# Patient Record
Sex: Female | Born: 1960 | Race: White | Hispanic: No | Marital: Married | Smoking: Never smoker
Health system: Southern US, Community
[De-identification: ages and names within clinical notes are randomized; demographics above are authoritative.]

## PROBLEM LIST (undated history)

## (undated) DIAGNOSIS — F419 Anxiety disorder, unspecified: Secondary | ICD-10-CM

## (undated) HISTORY — PX: ABDOMINAL HYSTERECTOMY: SHX81

## (undated) HISTORY — PX: KNEE SURGERY: SHX244

---

## 2000-09-08 ENCOUNTER — Inpatient Hospital Stay (HOSPITAL_COMMUNITY): Admission: AD | Admit: 2000-09-08 | Discharge: 2000-09-12 | Payer: Self-pay | Admitting: Obstetrics and Gynecology

## 2000-09-08 ENCOUNTER — Encounter (INDEPENDENT_AMBULATORY_CARE_PROVIDER_SITE_OTHER): Payer: Self-pay | Admitting: Specialist

## 2000-09-13 ENCOUNTER — Encounter: Admission: RE | Admit: 2000-09-13 | Discharge: 2000-10-13 | Payer: Self-pay | Admitting: Obstetrics & Gynecology

## 2000-10-23 ENCOUNTER — Other Ambulatory Visit: Admission: RE | Admit: 2000-10-23 | Discharge: 2000-10-23 | Payer: Self-pay | Admitting: *Deleted

## 2001-07-10 ENCOUNTER — Other Ambulatory Visit: Admission: RE | Admit: 2001-07-10 | Discharge: 2001-07-10 | Payer: Self-pay | Admitting: *Deleted

## 2002-08-27 ENCOUNTER — Other Ambulatory Visit: Admission: RE | Admit: 2002-08-27 | Discharge: 2002-08-27 | Payer: Self-pay | Admitting: *Deleted

## 2003-09-16 ENCOUNTER — Other Ambulatory Visit: Admission: RE | Admit: 2003-09-16 | Discharge: 2003-09-16 | Payer: Self-pay | Admitting: Obstetrics & Gynecology

## 2004-01-07 ENCOUNTER — Encounter (INDEPENDENT_AMBULATORY_CARE_PROVIDER_SITE_OTHER): Payer: Self-pay | Admitting: *Deleted

## 2004-01-07 ENCOUNTER — Ambulatory Visit (HOSPITAL_COMMUNITY): Admission: RE | Admit: 2004-01-07 | Discharge: 2004-01-07 | Payer: Self-pay | Admitting: Obstetrics & Gynecology

## 2004-12-07 ENCOUNTER — Other Ambulatory Visit: Admission: RE | Admit: 2004-12-07 | Discharge: 2004-12-07 | Payer: Self-pay | Admitting: Obstetrics & Gynecology

## 2007-09-30 ENCOUNTER — Emergency Department (HOSPITAL_BASED_OUTPATIENT_CLINIC_OR_DEPARTMENT_OTHER): Admission: EM | Admit: 2007-09-30 | Discharge: 2007-09-30 | Payer: Self-pay | Admitting: Emergency Medicine

## 2010-05-20 NOTE — Op Note (Signed)
[facility] of [facility]  Patient:    Tiffany Ross, Tiffany Ross Visit Number: [id number] MRN: [id number]          Service Type: OBS Location: 9300 [id number] Attending Physician:  Minette Headland Dictated by:   Freddy Finner, M.D. Proc. Date: 09/08/00 Admit Date:  09/08/2000                             Operative Report  PREOPERATIVE DIAGNOSIS:       Intrauterine pregnancy at 35-1/[redacted] weeks                               gestation with placental abruption.  POSTOPERATIVE DIAGNOSIS:      Intrauterine pregnancy at 35-1/[redacted] weeks                               gestation with placental abruption.  OPERATION:                    Primary low transverse cervical cesarean                               section with delivery of a viable female infant;                               NICU team in attendance, with Apgars of 3, 6 and                               7.  Arterial cord blood for gases is pending.  SURGEON:                      Freddy Finner, M.D.  ANESTHESIA:                   GET.  ESTIMATED INTRAOPERATIVE BLOOD LOSS:                   600 cc.  INTRAOPERATIVE COMPLICATIONS:                None.  INDICATIONS:                  The details of the present illness are recorded in the admission note.  DESCRIPTION OF PROCEDURE:     The patient was admitted from triage directly to the operating room under emergency circumstances.  She was brought to the operating room, placed in the dorsal recumbent position, a Foley catheter was placed, the abdomen was prepped and draped.  After induction of anesthesia, a low abdominal transverse incision was made and carried sharply down to the fascia which was entered sharply and extended to the extent of the skin incision.  The rectus sheath was developed superiorly and inferiorly with blunt and sharp dissection.  The rectus muscle was divided in the midline. The peritoneum was entered sharply and extended bluntly to the extent of  the skin incision.  A bladder blade was placed.  A transverse incision was made in the visceral peritoneum overlying the lower uterine segment.  The bladder was bluntly dissected off the lower segment.  A transverse incision was made  in the lower uterine segment and extended bluntly in a transverse direction.  A viable female infant was then delivered without difficulty.  The placenta was posterior with an area probably representing a 10% abruption as best could be determined after manual removal of the placenta.  There were three vessels to the cord.  Arterial cord blood was obtained for gases and routine venous blood was obtained.  After a careful evacuation of the uterine cavity, the uterine incision was closed in a single layer with running-locking 0 Monocryl.  A couple of interrupteds were required for complete hemostasis.  The bladder flap was reapproximated with a figure-of-eight of 0 Monocryl in the midline. Irrigation was carried out.  Hemostasis was adequate.  The tubes and ovaries on each side were inspected and found to be normal.  All packs, needles and instruments were removed and the counts were correct.  The abdominal incision was closed in layers; a running 0 Monocryl was used to close the peritoneum and reapproximate the rectus muscles.  A #1 Vicryl was used to close the fascia, the skin was closed with wide skin staples and quarter-inch Steri-Strips.  The patient tolerated the procedure well and was taken to recovery in good condition. Dictated by:   Freddy Finner, M.D. Attending Physician:  Minette Headland DD:  09/08/00 TD:  09/08/00 Job: [id number]

## 2010-05-20 NOTE — H&P (Signed)
[facility] of [facility]  Patient:    Tiffany Ross, Tiffany Ross Visit Number: [id number] MRN: [id number]          Service Type: OBS Location: 9300 [id number] Attending Physician:  Minette Headland Dictated by:   Freddy Finner, M.D. Admit Date:  09/08/2000                           History and Physical  ADMITTING DIAGNOSES:          1. Intrauterine pregnancy at 35-1/[redacted] weeks                                  gestation.                               2. Placenta abruption.  HISTORY OF PRESENT ILLNESS:   The patient is a 50 year old gravida 5, para 2, who has had an uncomplicated pregnancy to date, but presented with a history of heavy bright-red bleeding vaginally at approximately 2 a.m.  She presented to the emergency room where a decreased fetal heart rate was noted and evidence of bright bleeding and uterine irritability and regular contractions. On examination she was 2 cm dilated, approximately 90% effaced and there was dark-red blood consistent with abruption.  REVIEW OF SYSTEMS:            Her current review of systems is otherwise negative.  PAST HISTORY AND FAMILY HISTORY:                      According to the prenatal summary.  PHYSICAL EXAMINATION:  GENERAL:                      The patient was alert and oriented.  HEENT:                        Grossly within normal limits.  CHEST:                        Clear to auscultation.  HEART:                        Normal sinus rhythm without murmurs, rubs or gallops.  ABDOMEN:                      Gravid.  EXTREMITIES:                  Without clubbing, cyanosis or edema.  PELVIC:                       Cervix as described above.  LABORATORY DATA:              Admission hemoglobin was 10.8.  ASSESSMENT:                   Intrauterine pregnancy at 35-1/[redacted] weeks gestation with partial placental abruption.  PLAN:                         Cesarean delivery. Dictated by:   Freddy Finner, M.D. Attending  Physician:  Minette Headland DD:  09/08/00 TD:  09/08/00 Job:  [id number]

## 2010-05-20 NOTE — Discharge Summary (Signed)
[facility] of [facility]  Patient:    Tiffany Ross, Tiffany Ross Visit Number: [id number] MRN: [id number]          Service Type: OBS Location: 9300 [id number] [id number] Attending Physician:  Minette Headland Dictated by:   Danie Chandler, R.N. Admit Date:  09/08/2000 Discharge Date: 09/12/2000                             Discharge Summary  ADMISSION DIAGNOSIS:          Intrauterine pregnancy at 35-1/[redacted] weeks gestation with placental abruption.  DISCHARGE DIAGNOSES:          1. Intrauterine pregnancy at 35-1/[redacted] weeks                                  gestation with placental abruption.                               2. Anemia.  PROCEDURE:                    On September 08, 2000, primary low transverse cervical cesarean section.  REASON FOR ADMISSION:         Please see dictated H&P.  HOSPITAL COURSE:              The patient was taken to the operating room and underwent the above named procedure without complications. This was productive of a viable female infant with Apgars of 3 at one minute, 6 at five minutes, and 7 at 10 minutes.  Postoperatively, on day #1, the patient was without complaint.  Her hemoglobin on this day was 8.9, hematocrit 25.0 and white blood cell count 17.2, platelets were 172,000.  On postoperative day #2, she was ambulating well without difficulty.  Her hemoglobin on this day was 6.9, hematocrit 19.4, and platelets 137,000.  On postoperative day #3, the patient had good control of pain. She was tolerating a regular diet and had a good return of bowel function.  Her hemoglobin on this day was 6.4 and she was continued on iron. She was discharged home on postoperative day #4. Her hemoglobin on this day was 6.8.  CONDITION ON DISCHARGE:       Good.  DIET:                         Regular as tolerated.  ACTIVITY:                     No heavy lifting, no driving, no vaginal entry.  FOLLOW-UP:                    She is to follow up in the office in one  week. She is to call for temperature greater than 100 degrees, persistent nausea, vomiting, heavy vaginal bleeding, and/or redness from the incision site.  DISCHARGE MEDICATIONS:        1. Prenatal vitamin one p.o. q.d.                               2. Percocet as directed by M.D.  3. Motrin as directed by M.D.                               4. Iron daily. Dictated by:   Danie Chandler, R.N. Attending Physician:  Minette Headland DD:  10/02/00 TD:  10/02/00 Job: [id number]

## 2010-05-20 NOTE — Op Note (Signed)
Tiffany Ross, Tiffany Ross                 ACCOUNT NO.:  [id number]   MEDICAL RECORD NO.:  [id number]          PATIENT TYPE:  AMB   LOCATION:  SDC                           FACILITY:  [facility]   PHYSICIAN:  Freddy Finner, M.D.   DATE OF BIRTH:  31-Jul-1960   DATE OF PROCEDURE:  01/07/2004  DATE OF DISCHARGE:                                 OPERATIVE REPORT   PREOPERATIVE DIAGNOSES:  1.  Dysfunctional uterine bleeding.  2.  Endometrial polyp.  On ultrasound histogram, ultrasound findings      consistent with adenomyosis.   POSTOPERATIVE DIAGNOSES:  1.  Dysfunctional uterine bleeding.  2.  Endometrial polyp.  On ultrasound histogram, ultrasound findings      consistent with adenomyosis.   OPERATIVE PROCEDURE:  Hysteroscopy, D&C, resection of polyp.   ANESTHESIA:  Monitored intravenous sedation with paracervical block using 1%  plain Xylocaine.  Postoperative analgesia of Toradol 30 mg IV, 30 mg IM on  completion of the procedure.   INTRAOPERATIVE COMPLICATIONS:  None.   The patient is a 50 year old with irregular bleeding unrelieved by hormonal  cyclic therapy.  Her ultrasound histogram showed an endometrial polyp and  changes in the uterine wall consistent with adenomyosis.   She was admitted on the morning of surgery.  She was brought to the  operating room, placed under adequate intravenous sedation, placed in the  dorsal lithotomy position using the Mansura stirrups system.  Betadine prep  was carried out in the usual fashion.  Sterile drapes were applied.  A  bivalved speculum was introduced into the vagina.  The cervix was grasped  from the anterior lip with a single-toothed tenaculum.  A paracervical block  was placed using 10 cubic centimeters of 1% plain Xylocaine.  The uterus  sounded to 9 cm.  Placing the 12-1/2 degree hysteroscope was introduced  using 3% __________  as the distending medium.  Inspection revealed a polyp  as expected.  It was located on the lateral endometrial  surface to the  patient's left.  Gentle thorough curettage with exploration with the Randall  stone forceps and curettage was carried out.  Re-inspection revealed  adequate endometrial sampling and resection of the polyp.  The procedure at  this point was terminated.  All instruments were removed.  Estimated blood  loss was less than 10 cc.  __________ was approximately 80 cc.  The patient  tolerated the procedure well and was taken to the recovery room in good  condition and will be discharged home with routine outpatient surgical  instructions and she is given Darvocet to be taken as needed for  postoperative pain severe pain unmanaged by ibuprofen or Tylenol.     [name]   WRN/MEDQ  D:  01/07/2004  T:  01/07/2004  Job:  [id number]

## 2010-12-09 ENCOUNTER — Other Ambulatory Visit: Payer: Self-pay | Admitting: Obstetrics & Gynecology

## 2011-04-24 ENCOUNTER — Other Ambulatory Visit: Payer: Self-pay | Admitting: Obstetrics & Gynecology

## 2014-06-15 ENCOUNTER — Other Ambulatory Visit: Payer: Self-pay | Admitting: Obstetrics & Gynecology

## 2014-06-16 LAB — CYTOLOGY - PAP

## 2015-10-29 DIAGNOSIS — S01512A Laceration without foreign body of oral cavity, initial encounter: Secondary | ICD-10-CM | POA: Diagnosis not present

## 2015-10-29 DIAGNOSIS — Z23 Encounter for immunization: Secondary | ICD-10-CM | POA: Diagnosis not present

## 2016-03-15 DIAGNOSIS — Z13220 Encounter for screening for lipoid disorders: Secondary | ICD-10-CM | POA: Diagnosis not present

## 2016-03-15 DIAGNOSIS — Z130 Encounter for screening for diseases of the blood and blood-forming organs and certain disorders involving the immune mechanism: Secondary | ICD-10-CM | POA: Diagnosis not present

## 2016-03-15 DIAGNOSIS — Z131 Encounter for screening for diabetes mellitus: Secondary | ICD-10-CM | POA: Diagnosis not present

## 2016-03-15 DIAGNOSIS — L603 Nail dystrophy: Secondary | ICD-10-CM | POA: Diagnosis not present

## 2016-03-15 DIAGNOSIS — Z0000 Encounter for general adult medical examination without abnormal findings: Secondary | ICD-10-CM | POA: Diagnosis not present

## 2016-03-15 DIAGNOSIS — M8589 Other specified disorders of bone density and structure, multiple sites: Secondary | ICD-10-CM | POA: Diagnosis not present

## 2016-03-27 DIAGNOSIS — L723 Sebaceous cyst: Secondary | ICD-10-CM | POA: Insufficient documentation

## 2016-03-27 HISTORY — DX: Sebaceous cyst: L72.3

## 2016-04-14 DIAGNOSIS — L720 Epidermal cyst: Secondary | ICD-10-CM | POA: Diagnosis not present

## 2016-04-14 DIAGNOSIS — D1724 Benign lipomatous neoplasm of skin and subcutaneous tissue of left leg: Secondary | ICD-10-CM | POA: Insufficient documentation

## 2016-04-14 DIAGNOSIS — L723 Sebaceous cyst: Secondary | ICD-10-CM | POA: Diagnosis not present

## 2016-04-14 HISTORY — DX: Benign lipomatous neoplasm of skin and subcutaneous tissue of left leg: D17.24

## 2016-07-13 DIAGNOSIS — R05 Cough: Secondary | ICD-10-CM | POA: Diagnosis not present

## 2016-07-13 DIAGNOSIS — J0100 Acute maxillary sinusitis, unspecified: Secondary | ICD-10-CM | POA: Diagnosis not present

## 2016-08-02 DIAGNOSIS — L237 Allergic contact dermatitis due to plants, except food: Secondary | ICD-10-CM | POA: Diagnosis not present

## 2016-08-02 DIAGNOSIS — M25572 Pain in left ankle and joints of left foot: Secondary | ICD-10-CM | POA: Diagnosis not present

## 2016-08-02 DIAGNOSIS — M25472 Effusion, left ankle: Secondary | ICD-10-CM | POA: Diagnosis not present

## 2016-08-02 DIAGNOSIS — M79672 Pain in left foot: Secondary | ICD-10-CM | POA: Diagnosis not present

## 2016-08-30 DIAGNOSIS — Z6827 Body mass index (BMI) 27.0-27.9, adult: Secondary | ICD-10-CM | POA: Diagnosis not present

## 2016-08-30 DIAGNOSIS — Z01419 Encounter for gynecological examination (general) (routine) without abnormal findings: Secondary | ICD-10-CM | POA: Diagnosis not present

## 2016-08-30 DIAGNOSIS — Z1231 Encounter for screening mammogram for malignant neoplasm of breast: Secondary | ICD-10-CM | POA: Diagnosis not present

## 2016-11-01 DIAGNOSIS — M79671 Pain in right foot: Secondary | ICD-10-CM | POA: Diagnosis not present

## 2016-11-16 DIAGNOSIS — S93491A Sprain of other ligament of right ankle, initial encounter: Secondary | ICD-10-CM | POA: Diagnosis not present

## 2016-11-16 DIAGNOSIS — M76891 Other specified enthesopathies of right lower limb, excluding foot: Secondary | ICD-10-CM | POA: Diagnosis not present

## 2016-11-16 DIAGNOSIS — M7631 Iliotibial band syndrome, right leg: Secondary | ICD-10-CM | POA: Diagnosis not present

## 2016-11-20 DIAGNOSIS — M76891 Other specified enthesopathies of right lower limb, excluding foot: Secondary | ICD-10-CM | POA: Diagnosis not present

## 2016-11-20 DIAGNOSIS — M7631 Iliotibial band syndrome, right leg: Secondary | ICD-10-CM | POA: Diagnosis not present

## 2016-11-21 DIAGNOSIS — M76891 Other specified enthesopathies of right lower limb, excluding foot: Secondary | ICD-10-CM | POA: Diagnosis not present

## 2016-11-21 DIAGNOSIS — M7631 Iliotibial band syndrome, right leg: Secondary | ICD-10-CM | POA: Diagnosis not present

## 2016-11-27 DIAGNOSIS — M76891 Other specified enthesopathies of right lower limb, excluding foot: Secondary | ICD-10-CM | POA: Diagnosis not present

## 2016-11-27 DIAGNOSIS — M7631 Iliotibial band syndrome, right leg: Secondary | ICD-10-CM | POA: Diagnosis not present

## 2016-11-30 DIAGNOSIS — M7631 Iliotibial band syndrome, right leg: Secondary | ICD-10-CM | POA: Diagnosis not present

## 2016-11-30 DIAGNOSIS — M76891 Other specified enthesopathies of right lower limb, excluding foot: Secondary | ICD-10-CM | POA: Diagnosis not present

## 2016-12-04 DIAGNOSIS — M76891 Other specified enthesopathies of right lower limb, excluding foot: Secondary | ICD-10-CM | POA: Diagnosis not present

## 2016-12-04 DIAGNOSIS — M7631 Iliotibial band syndrome, right leg: Secondary | ICD-10-CM | POA: Diagnosis not present

## 2016-12-07 DIAGNOSIS — M7631 Iliotibial band syndrome, right leg: Secondary | ICD-10-CM | POA: Diagnosis not present

## 2016-12-07 DIAGNOSIS — M76891 Other specified enthesopathies of right lower limb, excluding foot: Secondary | ICD-10-CM | POA: Diagnosis not present

## 2016-12-13 DIAGNOSIS — M76891 Other specified enthesopathies of right lower limb, excluding foot: Secondary | ICD-10-CM | POA: Diagnosis not present

## 2016-12-13 DIAGNOSIS — M7631 Iliotibial band syndrome, right leg: Secondary | ICD-10-CM | POA: Diagnosis not present

## 2016-12-15 DIAGNOSIS — M76891 Other specified enthesopathies of right lower limb, excluding foot: Secondary | ICD-10-CM | POA: Diagnosis not present

## 2016-12-15 DIAGNOSIS — M7631 Iliotibial band syndrome, right leg: Secondary | ICD-10-CM | POA: Diagnosis not present

## 2016-12-18 DIAGNOSIS — M7631 Iliotibial band syndrome, right leg: Secondary | ICD-10-CM | POA: Diagnosis not present

## 2016-12-18 DIAGNOSIS — M76891 Other specified enthesopathies of right lower limb, excluding foot: Secondary | ICD-10-CM | POA: Diagnosis not present

## 2016-12-19 DIAGNOSIS — S93491D Sprain of other ligament of right ankle, subsequent encounter: Secondary | ICD-10-CM | POA: Diagnosis not present

## 2016-12-19 DIAGNOSIS — M76891 Other specified enthesopathies of right lower limb, excluding foot: Secondary | ICD-10-CM | POA: Diagnosis not present

## 2016-12-19 DIAGNOSIS — M7631 Iliotibial band syndrome, right leg: Secondary | ICD-10-CM | POA: Diagnosis not present

## 2016-12-21 DIAGNOSIS — M76891 Other specified enthesopathies of right lower limb, excluding foot: Secondary | ICD-10-CM | POA: Diagnosis not present

## 2016-12-21 DIAGNOSIS — M7631 Iliotibial band syndrome, right leg: Secondary | ICD-10-CM | POA: Diagnosis not present

## 2016-12-27 DIAGNOSIS — M76891 Other specified enthesopathies of right lower limb, excluding foot: Secondary | ICD-10-CM | POA: Diagnosis not present

## 2017-01-01 DIAGNOSIS — M76891 Other specified enthesopathies of right lower limb, excluding foot: Secondary | ICD-10-CM | POA: Diagnosis not present

## 2017-01-01 DIAGNOSIS — M7061 Trochanteric bursitis, right hip: Secondary | ICD-10-CM | POA: Diagnosis not present

## 2017-01-01 DIAGNOSIS — M7631 Iliotibial band syndrome, right leg: Secondary | ICD-10-CM | POA: Diagnosis not present

## 2017-01-15 DIAGNOSIS — M7631 Iliotibial band syndrome, right leg: Secondary | ICD-10-CM | POA: Diagnosis not present

## 2017-01-15 DIAGNOSIS — M76891 Other specified enthesopathies of right lower limb, excluding foot: Secondary | ICD-10-CM | POA: Diagnosis not present

## 2017-01-18 DIAGNOSIS — M76891 Other specified enthesopathies of right lower limb, excluding foot: Secondary | ICD-10-CM | POA: Diagnosis not present

## 2017-01-18 DIAGNOSIS — M7631 Iliotibial band syndrome, right leg: Secondary | ICD-10-CM | POA: Diagnosis not present

## 2017-01-23 DIAGNOSIS — M7631 Iliotibial band syndrome, right leg: Secondary | ICD-10-CM | POA: Diagnosis not present

## 2017-01-23 DIAGNOSIS — M76891 Other specified enthesopathies of right lower limb, excluding foot: Secondary | ICD-10-CM | POA: Diagnosis not present

## 2017-01-25 DIAGNOSIS — M7631 Iliotibial band syndrome, right leg: Secondary | ICD-10-CM | POA: Diagnosis not present

## 2017-01-25 DIAGNOSIS — M76891 Other specified enthesopathies of right lower limb, excluding foot: Secondary | ICD-10-CM | POA: Diagnosis not present

## 2017-01-30 DIAGNOSIS — M24151 Other articular cartilage disorders, right hip: Secondary | ICD-10-CM | POA: Diagnosis not present

## 2017-01-30 DIAGNOSIS — M25551 Pain in right hip: Secondary | ICD-10-CM | POA: Diagnosis not present

## 2017-02-07 DIAGNOSIS — M1611 Unilateral primary osteoarthritis, right hip: Secondary | ICD-10-CM

## 2017-02-07 HISTORY — DX: Unilateral primary osteoarthritis, right hip: M16.11

## 2017-06-04 DIAGNOSIS — M25562 Pain in left knee: Secondary | ICD-10-CM | POA: Diagnosis not present

## 2017-06-04 DIAGNOSIS — M238X2 Other internal derangements of left knee: Secondary | ICD-10-CM | POA: Diagnosis not present

## 2017-06-13 DIAGNOSIS — M25562 Pain in left knee: Secondary | ICD-10-CM | POA: Diagnosis not present

## 2017-06-20 DIAGNOSIS — M2242 Chondromalacia patellae, left knee: Secondary | ICD-10-CM | POA: Diagnosis not present

## 2017-06-20 DIAGNOSIS — M25562 Pain in left knee: Secondary | ICD-10-CM | POA: Diagnosis not present

## 2017-07-10 DIAGNOSIS — M25562 Pain in left knee: Secondary | ICD-10-CM | POA: Diagnosis not present

## 2017-08-01 DIAGNOSIS — B001 Herpesviral vesicular dermatitis: Secondary | ICD-10-CM | POA: Diagnosis not present

## 2017-08-01 DIAGNOSIS — L237 Allergic contact dermatitis due to plants, except food: Secondary | ICD-10-CM | POA: Diagnosis not present

## 2017-08-27 DIAGNOSIS — R5383 Other fatigue: Secondary | ICD-10-CM | POA: Diagnosis not present

## 2017-08-27 DIAGNOSIS — G44019 Episodic cluster headache, not intractable: Secondary | ICD-10-CM | POA: Diagnosis not present

## 2017-09-05 DIAGNOSIS — Z1231 Encounter for screening mammogram for malignant neoplasm of breast: Secondary | ICD-10-CM | POA: Diagnosis not present

## 2017-09-05 DIAGNOSIS — Z01419 Encounter for gynecological examination (general) (routine) without abnormal findings: Secondary | ICD-10-CM | POA: Diagnosis not present

## 2017-09-05 DIAGNOSIS — Z6827 Body mass index (BMI) 27.0-27.9, adult: Secondary | ICD-10-CM | POA: Diagnosis not present

## 2017-10-09 DIAGNOSIS — M67462 Ganglion, left knee: Secondary | ICD-10-CM | POA: Diagnosis not present

## 2017-10-25 DIAGNOSIS — M25562 Pain in left knee: Secondary | ICD-10-CM | POA: Diagnosis not present

## 2017-11-21 DIAGNOSIS — M9901 Segmental and somatic dysfunction of cervical region: Secondary | ICD-10-CM | POA: Diagnosis not present

## 2017-11-21 DIAGNOSIS — M50322 Other cervical disc degeneration at C5-C6 level: Secondary | ICD-10-CM | POA: Diagnosis not present

## 2017-11-21 DIAGNOSIS — M9902 Segmental and somatic dysfunction of thoracic region: Secondary | ICD-10-CM | POA: Diagnosis not present

## 2017-11-21 DIAGNOSIS — M7912 Myalgia of auxiliary muscles, head and neck: Secondary | ICD-10-CM | POA: Diagnosis not present

## 2017-11-23 DIAGNOSIS — M50322 Other cervical disc degeneration at C5-C6 level: Secondary | ICD-10-CM | POA: Diagnosis not present

## 2017-11-23 DIAGNOSIS — M9902 Segmental and somatic dysfunction of thoracic region: Secondary | ICD-10-CM | POA: Diagnosis not present

## 2017-11-23 DIAGNOSIS — M7912 Myalgia of auxiliary muscles, head and neck: Secondary | ICD-10-CM | POA: Diagnosis not present

## 2017-11-23 DIAGNOSIS — M9901 Segmental and somatic dysfunction of cervical region: Secondary | ICD-10-CM | POA: Diagnosis not present

## 2017-11-26 DIAGNOSIS — M7912 Myalgia of auxiliary muscles, head and neck: Secondary | ICD-10-CM | POA: Diagnosis not present

## 2017-11-26 DIAGNOSIS — M50322 Other cervical disc degeneration at C5-C6 level: Secondary | ICD-10-CM | POA: Diagnosis not present

## 2017-11-26 DIAGNOSIS — M9901 Segmental and somatic dysfunction of cervical region: Secondary | ICD-10-CM | POA: Diagnosis not present

## 2017-11-26 DIAGNOSIS — M9902 Segmental and somatic dysfunction of thoracic region: Secondary | ICD-10-CM | POA: Diagnosis not present

## 2017-11-27 DIAGNOSIS — M9901 Segmental and somatic dysfunction of cervical region: Secondary | ICD-10-CM | POA: Diagnosis not present

## 2017-11-27 DIAGNOSIS — M50322 Other cervical disc degeneration at C5-C6 level: Secondary | ICD-10-CM | POA: Diagnosis not present

## 2017-11-27 DIAGNOSIS — M9902 Segmental and somatic dysfunction of thoracic region: Secondary | ICD-10-CM | POA: Diagnosis not present

## 2017-11-27 DIAGNOSIS — M7912 Myalgia of auxiliary muscles, head and neck: Secondary | ICD-10-CM | POA: Diagnosis not present

## 2017-12-03 DIAGNOSIS — M9901 Segmental and somatic dysfunction of cervical region: Secondary | ICD-10-CM | POA: Diagnosis not present

## 2017-12-03 DIAGNOSIS — M7912 Myalgia of auxiliary muscles, head and neck: Secondary | ICD-10-CM | POA: Diagnosis not present

## 2017-12-03 DIAGNOSIS — M9902 Segmental and somatic dysfunction of thoracic region: Secondary | ICD-10-CM | POA: Diagnosis not present

## 2017-12-03 DIAGNOSIS — M50322 Other cervical disc degeneration at C5-C6 level: Secondary | ICD-10-CM | POA: Diagnosis not present

## 2017-12-05 DIAGNOSIS — M9901 Segmental and somatic dysfunction of cervical region: Secondary | ICD-10-CM | POA: Diagnosis not present

## 2017-12-05 DIAGNOSIS — M50322 Other cervical disc degeneration at C5-C6 level: Secondary | ICD-10-CM | POA: Diagnosis not present

## 2017-12-05 DIAGNOSIS — M9902 Segmental and somatic dysfunction of thoracic region: Secondary | ICD-10-CM | POA: Diagnosis not present

## 2017-12-05 DIAGNOSIS — M7912 Myalgia of auxiliary muscles, head and neck: Secondary | ICD-10-CM | POA: Diagnosis not present

## 2017-12-07 DIAGNOSIS — M7912 Myalgia of auxiliary muscles, head and neck: Secondary | ICD-10-CM | POA: Diagnosis not present

## 2017-12-07 DIAGNOSIS — M9901 Segmental and somatic dysfunction of cervical region: Secondary | ICD-10-CM | POA: Diagnosis not present

## 2017-12-07 DIAGNOSIS — M50322 Other cervical disc degeneration at C5-C6 level: Secondary | ICD-10-CM | POA: Diagnosis not present

## 2017-12-07 DIAGNOSIS — M9902 Segmental and somatic dysfunction of thoracic region: Secondary | ICD-10-CM | POA: Diagnosis not present

## 2017-12-10 DIAGNOSIS — M50322 Other cervical disc degeneration at C5-C6 level: Secondary | ICD-10-CM | POA: Diagnosis not present

## 2017-12-10 DIAGNOSIS — M7912 Myalgia of auxiliary muscles, head and neck: Secondary | ICD-10-CM | POA: Diagnosis not present

## 2017-12-10 DIAGNOSIS — M9902 Segmental and somatic dysfunction of thoracic region: Secondary | ICD-10-CM | POA: Diagnosis not present

## 2017-12-10 DIAGNOSIS — M9901 Segmental and somatic dysfunction of cervical region: Secondary | ICD-10-CM | POA: Diagnosis not present

## 2017-12-13 DIAGNOSIS — M9901 Segmental and somatic dysfunction of cervical region: Secondary | ICD-10-CM | POA: Diagnosis not present

## 2017-12-13 DIAGNOSIS — M7912 Myalgia of auxiliary muscles, head and neck: Secondary | ICD-10-CM | POA: Diagnosis not present

## 2017-12-13 DIAGNOSIS — M50322 Other cervical disc degeneration at C5-C6 level: Secondary | ICD-10-CM | POA: Diagnosis not present

## 2017-12-13 DIAGNOSIS — M9902 Segmental and somatic dysfunction of thoracic region: Secondary | ICD-10-CM | POA: Diagnosis not present

## 2017-12-17 DIAGNOSIS — M9901 Segmental and somatic dysfunction of cervical region: Secondary | ICD-10-CM | POA: Diagnosis not present

## 2017-12-17 DIAGNOSIS — M50322 Other cervical disc degeneration at C5-C6 level: Secondary | ICD-10-CM | POA: Diagnosis not present

## 2017-12-17 DIAGNOSIS — M7912 Myalgia of auxiliary muscles, head and neck: Secondary | ICD-10-CM | POA: Diagnosis not present

## 2017-12-17 DIAGNOSIS — M9902 Segmental and somatic dysfunction of thoracic region: Secondary | ICD-10-CM | POA: Diagnosis not present

## 2017-12-20 DIAGNOSIS — M50322 Other cervical disc degeneration at C5-C6 level: Secondary | ICD-10-CM | POA: Diagnosis not present

## 2017-12-20 DIAGNOSIS — M9902 Segmental and somatic dysfunction of thoracic region: Secondary | ICD-10-CM | POA: Diagnosis not present

## 2017-12-20 DIAGNOSIS — M7912 Myalgia of auxiliary muscles, head and neck: Secondary | ICD-10-CM | POA: Diagnosis not present

## 2017-12-20 DIAGNOSIS — M9901 Segmental and somatic dysfunction of cervical region: Secondary | ICD-10-CM | POA: Diagnosis not present

## 2017-12-24 DIAGNOSIS — M7912 Myalgia of auxiliary muscles, head and neck: Secondary | ICD-10-CM | POA: Diagnosis not present

## 2017-12-24 DIAGNOSIS — M50322 Other cervical disc degeneration at C5-C6 level: Secondary | ICD-10-CM | POA: Diagnosis not present

## 2017-12-24 DIAGNOSIS — M9901 Segmental and somatic dysfunction of cervical region: Secondary | ICD-10-CM | POA: Diagnosis not present

## 2017-12-24 DIAGNOSIS — M9902 Segmental and somatic dysfunction of thoracic region: Secondary | ICD-10-CM | POA: Diagnosis not present

## 2017-12-28 DIAGNOSIS — M9901 Segmental and somatic dysfunction of cervical region: Secondary | ICD-10-CM | POA: Diagnosis not present

## 2017-12-28 DIAGNOSIS — M50322 Other cervical disc degeneration at C5-C6 level: Secondary | ICD-10-CM | POA: Diagnosis not present

## 2017-12-28 DIAGNOSIS — M9902 Segmental and somatic dysfunction of thoracic region: Secondary | ICD-10-CM | POA: Diagnosis not present

## 2017-12-28 DIAGNOSIS — M7912 Myalgia of auxiliary muscles, head and neck: Secondary | ICD-10-CM | POA: Diagnosis not present

## 2017-12-31 DIAGNOSIS — M7912 Myalgia of auxiliary muscles, head and neck: Secondary | ICD-10-CM | POA: Diagnosis not present

## 2017-12-31 DIAGNOSIS — M50322 Other cervical disc degeneration at C5-C6 level: Secondary | ICD-10-CM | POA: Diagnosis not present

## 2017-12-31 DIAGNOSIS — M9902 Segmental and somatic dysfunction of thoracic region: Secondary | ICD-10-CM | POA: Diagnosis not present

## 2017-12-31 DIAGNOSIS — M9901 Segmental and somatic dysfunction of cervical region: Secondary | ICD-10-CM | POA: Diagnosis not present

## 2018-01-03 DIAGNOSIS — M50322 Other cervical disc degeneration at C5-C6 level: Secondary | ICD-10-CM | POA: Diagnosis not present

## 2018-01-03 DIAGNOSIS — M9901 Segmental and somatic dysfunction of cervical region: Secondary | ICD-10-CM | POA: Diagnosis not present

## 2018-01-10 DIAGNOSIS — M50322 Other cervical disc degeneration at C5-C6 level: Secondary | ICD-10-CM | POA: Diagnosis not present

## 2018-01-10 DIAGNOSIS — M9901 Segmental and somatic dysfunction of cervical region: Secondary | ICD-10-CM | POA: Diagnosis not present

## 2018-01-17 DIAGNOSIS — M9901 Segmental and somatic dysfunction of cervical region: Secondary | ICD-10-CM | POA: Diagnosis not present

## 2018-01-17 DIAGNOSIS — M50322 Other cervical disc degeneration at C5-C6 level: Secondary | ICD-10-CM | POA: Diagnosis not present

## 2018-01-22 DIAGNOSIS — M542 Cervicalgia: Secondary | ICD-10-CM | POA: Diagnosis not present

## 2018-02-01 DIAGNOSIS — M542 Cervicalgia: Secondary | ICD-10-CM | POA: Diagnosis not present

## 2018-02-02 DIAGNOSIS — M5030 Other cervical disc degeneration, unspecified cervical region: Secondary | ICD-10-CM | POA: Diagnosis not present

## 2018-02-08 DIAGNOSIS — M542 Cervicalgia: Secondary | ICD-10-CM | POA: Diagnosis not present

## 2018-02-15 DIAGNOSIS — M542 Cervicalgia: Secondary | ICD-10-CM | POA: Diagnosis not present

## 2018-02-21 DIAGNOSIS — M542 Cervicalgia: Secondary | ICD-10-CM | POA: Diagnosis not present

## 2018-02-27 DIAGNOSIS — M542 Cervicalgia: Secondary | ICD-10-CM | POA: Diagnosis not present

## 2018-06-03 DIAGNOSIS — S61201A Unspecified open wound of left index finger without damage to nail, initial encounter: Secondary | ICD-10-CM | POA: Diagnosis not present

## 2018-07-17 DIAGNOSIS — H43813 Vitreous degeneration, bilateral: Secondary | ICD-10-CM | POA: Diagnosis not present

## 2018-08-07 DIAGNOSIS — S91331A Puncture wound without foreign body, right foot, initial encounter: Secondary | ICD-10-CM | POA: Diagnosis not present

## 2018-08-21 DIAGNOSIS — Z23 Encounter for immunization: Secondary | ICD-10-CM | POA: Diagnosis not present

## 2018-10-23 DIAGNOSIS — Z1231 Encounter for screening mammogram for malignant neoplasm of breast: Secondary | ICD-10-CM | POA: Diagnosis not present

## 2018-10-23 DIAGNOSIS — Z13820 Encounter for screening for osteoporosis: Secondary | ICD-10-CM | POA: Diagnosis not present

## 2018-10-23 DIAGNOSIS — Z01419 Encounter for gynecological examination (general) (routine) without abnormal findings: Secondary | ICD-10-CM | POA: Diagnosis not present

## 2018-10-23 DIAGNOSIS — Z6828 Body mass index (BMI) 28.0-28.9, adult: Secondary | ICD-10-CM | POA: Diagnosis not present

## 2019-08-18 DIAGNOSIS — L237 Allergic contact dermatitis due to plants, except food: Secondary | ICD-10-CM | POA: Diagnosis not present

## 2019-08-31 ENCOUNTER — Other Ambulatory Visit: Payer: Self-pay

## 2019-08-31 ENCOUNTER — Encounter (HOSPITAL_BASED_OUTPATIENT_CLINIC_OR_DEPARTMENT_OTHER): Payer: Self-pay | Admitting: Emergency Medicine

## 2019-08-31 DIAGNOSIS — N132 Hydronephrosis with renal and ureteral calculous obstruction: Secondary | ICD-10-CM | POA: Diagnosis not present

## 2019-08-31 DIAGNOSIS — N138 Other obstructive and reflux uropathy: Secondary | ICD-10-CM | POA: Diagnosis not present

## 2019-08-31 DIAGNOSIS — N200 Calculus of kidney: Secondary | ICD-10-CM | POA: Insufficient documentation

## 2019-08-31 DIAGNOSIS — R1111 Vomiting without nausea: Secondary | ICD-10-CM | POA: Diagnosis not present

## 2019-08-31 DIAGNOSIS — R109 Unspecified abdominal pain: Secondary | ICD-10-CM | POA: Diagnosis not present

## 2019-08-31 DIAGNOSIS — I700 Atherosclerosis of aorta: Secondary | ICD-10-CM | POA: Diagnosis not present

## 2019-08-31 LAB — BASIC METABOLIC PANEL
Anion gap: 12 (ref 5–15)
BUN: 17 mg/dL (ref 6–20)
CO2: 24 mmol/L (ref 22–32)
Calcium: 10 mg/dL (ref 8.9–10.3)
Chloride: 102 mmol/L (ref 98–111)
Creatinine, Ser: 0.86 mg/dL (ref 0.44–1.00)
GFR calc Af Amer: >60 mL/min (ref >60)
GFR calc non Af Amer: >60 mL/min (ref >60)
Glucose, Bld: 130 mg/dL — ABNORMAL HIGH (ref 70–99)
Potassium: 4 mmol/L (ref 3.5–5.1)
Sodium: 138 mmol/L (ref 135–145)

## 2019-08-31 LAB — CBC
HCT: 42.5 % (ref 36.0–46.0)
Hemoglobin: 14.2 g/dL (ref 12.0–15.0)
MCH: 30 pg (ref 26.0–34.0)
MCHC: 33.4 g/dL (ref 30.0–36.0)
MCV: 89.9 fL (ref 80.0–100.0)
Platelets: 324 10*3/uL (ref 150–400)
RBC: 4.73 MIL/uL (ref 3.87–5.11)
RDW: 12.3 % (ref 11.5–15.5)
WBC: 10.3 10*3/uL (ref 4.0–10.5)
nRBC: 0 % (ref 0.0–0.2)

## 2019-08-31 MED ORDER — ONDANSETRON HCL 4 MG/2ML IJ SOLN
4.0000 mg | Freq: Once | INTRAMUSCULAR | Status: AC
  Administered 2019-08-31: 4 mg via INTRAVENOUS
  Filled 2019-08-31: qty 2

## 2019-08-31 MED ORDER — FENTANYL CITRATE (PF) 100 MCG/2ML IJ SOLN
50.0000 ug | INTRAMUSCULAR | Status: AC | PRN
  Administered 2019-08-31 – 2019-09-01 (×2): 50 ug via INTRAVENOUS
  Filled 2019-08-31 (×2): qty 2

## 2019-08-31 NOTE — ED Triage Notes (Signed)
Reports right flank pain that around 1930 with n/v.  Also having diarrhea.  Denies having any urinary symptoms.

## 2019-09-01 ENCOUNTER — Emergency Department (HOSPITAL_BASED_OUTPATIENT_CLINIC_OR_DEPARTMENT_OTHER)
Admission: EM | Admit: 2019-09-01 | Discharge: 2019-09-01 | Disposition: A | Discharge status: Home / Self Care | Payer: BC Managed Care – PPO | Attending: Emergency Medicine | Admitting: Emergency Medicine

## 2019-09-01 ENCOUNTER — Emergency Department (HOSPITAL_BASED_OUTPATIENT_CLINIC_OR_DEPARTMENT_OTHER): Payer: BC Managed Care – PPO

## 2019-09-01 DIAGNOSIS — N138 Other obstructive and reflux uropathy: Secondary | ICD-10-CM | POA: Diagnosis not present

## 2019-09-01 DIAGNOSIS — N132 Hydronephrosis with renal and ureteral calculous obstruction: Secondary | ICD-10-CM | POA: Diagnosis not present

## 2019-09-01 DIAGNOSIS — R1111 Vomiting without nausea: Secondary | ICD-10-CM | POA: Diagnosis not present

## 2019-09-01 DIAGNOSIS — N200 Calculus of kidney: Secondary | ICD-10-CM

## 2019-09-01 DIAGNOSIS — I700 Atherosclerosis of aorta: Secondary | ICD-10-CM | POA: Diagnosis not present

## 2019-09-01 HISTORY — DX: Anxiety disorder, unspecified: F41.9

## 2019-09-01 LAB — URINALYSIS, ROUTINE W REFLEX MICROSCOPIC
Bilirubin Urine: NEGATIVE
Glucose, UA: NEGATIVE mg/dL
Ketones, ur: NEGATIVE mg/dL
Leukocytes,Ua: NEGATIVE
Nitrite: NEGATIVE
Protein, ur: NEGATIVE mg/dL
Specific Gravity, Urine: 1.015 (ref 1.005–1.030)
pH: 8.5 — ABNORMAL HIGH (ref 5.0–8.0)

## 2019-09-01 LAB — URINALYSIS, MICROSCOPIC (REFLEX): WBC, UA: NONE SEEN WBC/hpf (ref 0–5)

## 2019-09-01 MED ORDER — KETOROLAC TROMETHAMINE 15 MG/ML IJ SOLN
15.0000 mg | Freq: Once | INTRAMUSCULAR | Status: AC
  Administered 2019-09-01: 15 mg via INTRAVENOUS
  Filled 2019-09-01: qty 1

## 2019-09-01 MED ORDER — ONDANSETRON HCL 4 MG/2ML IJ SOLN
4.0000 mg | Freq: Once | INTRAMUSCULAR | Status: AC
  Administered 2019-09-01: 4 mg via INTRAVENOUS
  Filled 2019-09-01: qty 2

## 2019-09-01 MED ORDER — TAMSULOSIN HCL 0.4 MG PO CAPS: 0.4000 mg | ORAL_CAPSULE | Freq: Every day | ORAL | 0 refills | Status: DC

## 2019-09-01 MED ORDER — ONDANSETRON 4 MG PO TBDP: 4.0000 mg | ORAL_TABLET | Freq: Three times a day (TID) | ORAL | 0 refills | Status: DC | PRN

## 2019-09-01 MED ORDER — HYDROCODONE-ACETAMINOPHEN 5-325 MG PO TABS: 1.0000 | ORAL_TABLET | Freq: Four times a day (QID) | ORAL | 0 refills | Status: DC | PRN

## 2019-09-01 MED ORDER — SODIUM CHLORIDE 0.9 % IV BOLUS
1000.0000 mL | Freq: Once | INTRAVENOUS | Status: AC
  Administered 2019-09-01: 1000 mL via INTRAVENOUS

## 2019-09-01 NOTE — Discharge Instructions (Addendum)
You were seen today for abdominal pain.  You have a kidney stone on the right side.  Make sure to stay hydrated.  Take medications as prescribed.  Follow-up with urology as needed.

## 2019-09-01 NOTE — ED Provider Notes (Signed)
[facility] EMERGENCY DEPARTMENT Provider Note   CSN: [id number] Arrival date & time: 08/31/19  2239     History Chief Complaint  Patient presents with  . Flank Pain    Tiffany Ross is a 59 y.o. female.  HPI     This is a 59 year old female who presents with abdominal pain.  Patient reports that she had "a good day yesterday."  She states that around 7:30 PM she noted some right-sided abdominal and flank pain.  She describes the pain as "weird."  It comes and goes.  Nothing seems to make it better or worse.  She states her pain in the waiting room was 8 out of 10.  Is now 3 out of 10 after fentanyl.  She is not noted any fevers or urinary symptoms.  She has had some nausea and vomiting.  No diarrhea.  Reports normal bowel movements.  No history of kidney stones.  Past Medical History:  Diagnosis Date  . Anxiety     There are no problems to display for this patient.   Past Surgical History:  Procedure Laterality Date  . ABDOMINAL HYSTERECTOMY    . KNEE SURGERY       OB History   No obstetric history on file.     No family history on file.  Social History   Tobacco Use  . Smoking status: Never Smoker  . Smokeless tobacco: Never Used  Substance Use Topics  . Alcohol use: Yes    Alcohol/week: 1.0 standard drink    Types: 1 Glasses of wine per week    Comment: daily  . Drug use: Never    Home Medications Prior to Admission medications   Medication Sig Start Date End Date Taking? Authorizing Provider  HYDROcodone-acetaminophen (NORCO/VICODIN) 5-325 MG tablet Take 1 tablet by mouth every 6 (six) hours as needed. 09/01/19   Taray Normoyle, Barbette Hair, MD  ondansetron (ZOFRAN ODT) 4 MG disintegrating tablet Take 1 tablet (4 mg total) by mouth every 8 (eight) hours as needed. 09/01/19   Kassadi Presswood, Barbette Hair, MD  tamsulosin (FLOMAX) 0.4 MG CAPS capsule Take 1 capsule (0.4 mg total) by mouth daily. 09/01/19   Adean Milosevic, Barbette Hair, MD    Allergies    Codeine and Sulfa  antibiotics  Review of Systems   Review of Systems  Constitutional: Negative for fever.  Respiratory: Negative for shortness of breath.   Cardiovascular: Negative for chest pain.  Gastrointestinal: Positive for abdominal pain.  Genitourinary: Positive for flank pain. Negative for dysuria and hematuria.  Musculoskeletal: Positive for back pain.  All other systems reviewed and are negative.   Physical Exam Updated Vital Signs BP 118/61   Pulse 64   Temp 98.1 F (36.7 C) (Oral)   Resp 16   Ht 1.6 m (5\' 3" )   Wt 71.2 kg   SpO2 99%   BMI 27.81 kg/m   Physical Exam Vitals and nursing note reviewed.  Constitutional:      Appearance: She is well-developed.  HENT:     Head: Normocephalic and atraumatic.     Mouth/Throat:     Mouth: Mucous membranes are moist.  Eyes:     Pupils: Pupils are equal, round, and reactive to light.  Cardiovascular:     Rate and Rhythm: Normal rate and regular rhythm.     Heart sounds: Normal heart sounds.  Pulmonary:     Effort: Pulmonary effort is normal. No respiratory distress.     Breath sounds: No wheezing.  Abdominal:     General: Bowel sounds are normal.     Palpations: Abdomen is soft.     Tenderness: There is no abdominal tenderness. There is no right CVA tenderness, left CVA tenderness, guarding or rebound.  Musculoskeletal:     Cervical back: Neck supple.     Right lower leg: No edema.     Left lower leg: No edema.  Skin:    General: Skin is warm and dry.  Neurological:     Mental Status: She is alert and oriented to person, place, and time.  Psychiatric:        Mood and Affect: Mood normal.     ED Results / Procedures / Treatments   Labs (all labs ordered are listed, but only abnormal results are displayed) Labs Reviewed  URINALYSIS, ROUTINE W REFLEX MICROSCOPIC - Abnormal; Notable for the following components:      Result Value   APPearance CLOUDY (*)    pH 8.5 (*)    Hgb urine dipstick LARGE (*)    All other  components within normal limits  BASIC METABOLIC PANEL - Abnormal; Notable for the following components:   Glucose, Bld 130 (*)    All other components within normal limits  URINALYSIS, MICROSCOPIC (REFLEX) - Abnormal; Notable for the following components:   Bacteria, UA FEW (*)    All other components within normal limits  CBC    EKG None  Radiology CT Renal Stone Study  Result Date: 09/01/2019 CLINICAL DATA:  Right flank pain since yesterday evening, nausea and vomiting, diarrhea EXAM: CT ABDOMEN AND PELVIS WITHOUT CONTRAST TECHNIQUE: Multidetector CT imaging of the abdomen and pelvis was performed following the standard protocol without IV contrast. COMPARISON:  None. FINDINGS: Lower chest: No acute pleural or parenchymal lung disease. Hepatobiliary: No focal liver abnormality is seen. No gallstones, gallbladder wall thickening, or biliary dilatation. Pancreas: Unremarkable. No pancreatic ductal dilatation or surrounding inflammatory changes. Spleen: Normal in size without focal abnormality. Adrenals/Urinary Tract: There is mild right-sided obstructive uropathy related to a 4 mm proximal right ureteral calculus, reference image 37/2. The left kidney is unremarkable. The bladder is grossly normal. The adrenals are normal. Stomach/Bowel: No bowel obstruction or ileus. Normal appendix right lower quadrant. No bowel wall thickening or inflammatory change. Vascular/Lymphatic: Aortic atherosclerosis. No enlarged abdominal or pelvic lymph nodes. Reproductive: Status post hysterectomy. No adnexal masses. Other: No free fluid or free gas.  No abdominal wall hernia. Musculoskeletal: No acute or destructive bony lesions. Reconstructed images demonstrate no additional findings. IMPRESSION: 1. Mild right-sided obstructive uropathy related to a 4 mm proximal right ureteral calculus. 2.  Aortic Atherosclerosis (ICD10-I70.0). Electronically Signed   By: Randa Ngo M.D.   On: 09/01/2019 01:43     Procedures Procedures (including critical care time)  Medications Ordered in ED Medications  fentaNYL (SUBLIMAZE) injection 50 mcg (50 mcg Intravenous Given 09/01/19 0029)  ondansetron (ZOFRAN) injection 4 mg (4 mg Intravenous Given 08/31/19 2307)  sodium chloride 0.9 % bolus 1,000 mL (0 mLs Intravenous Stopped 09/01/19 0319)  ketorolac (TORADOL) 15 MG/ML injection 15 mg (15 mg Intravenous Given 09/01/19 0204)  ondansetron (ZOFRAN) injection 4 mg (4 mg Intravenous Given 09/01/19 0205)    ED Course  I have reviewed the triage vital signs and the nursing notes.  Pertinent labs & imaging results that were available during my care of the patient were reviewed by me and considered in my medical decision making (see chart for details).    MDM Rules/Calculators/A&P  Patient presents with acute onset back and abdominal pain.  She is overall nontoxic and vital signs are reassuring.  Highly suspicious of kidney stone.  Urinalysis without evidence of UTI.  Basic lab work is reassuring including BMP without evidence of AKI.  Patient was given fluids, nausea, pain medication.  CT scan shows a 4 mm stone.  Discussed with her supportive management at home and expectant management.  She was given urology follow-up.  After history, exam, and medical workup I feel the patient has been appropriately medically screened and is safe for discharge home. Pertinent diagnoses were discussed with the patient. Patient was given return precautions.   Final Clinical Impression(s) / ED Diagnoses Final diagnoses:  Kidney stone    Rx / DC Orders ED Discharge Orders         Ordered    ondansetron (ZOFRAN ODT) 4 MG disintegrating tablet  Every 8 hours PRN        09/01/19 0241    tamsulosin (FLOMAX) 0.4 MG CAPS capsule  Daily        09/01/19 0241    HYDROcodone-acetaminophen (NORCO/VICODIN) 5-325 MG tablet  Every 6 hours PRN        09/01/19 0241           Merryl Hacker,  MD 09/01/19 2334

## 2019-09-01 NOTE — ED Notes (Signed)
Taken to CT at this time. 

## 2019-10-28 DIAGNOSIS — Z1231 Encounter for screening mammogram for malignant neoplasm of breast: Secondary | ICD-10-CM | POA: Diagnosis not present

## 2019-10-28 DIAGNOSIS — Z6827 Body mass index (BMI) 27.0-27.9, adult: Secondary | ICD-10-CM | POA: Diagnosis not present

## 2019-10-28 DIAGNOSIS — Z01419 Encounter for gynecological examination (general) (routine) without abnormal findings: Secondary | ICD-10-CM | POA: Diagnosis not present

## 2019-10-28 LAB — HM MAMMOGRAPHY

## 2019-10-28 LAB — HM PAP SMEAR: HM Pap smear: NEGATIVE

## 2020-05-26 ENCOUNTER — Ambulatory Visit (INDEPENDENT_AMBULATORY_CARE_PROVIDER_SITE_OTHER): Payer: BC Managed Care – PPO

## 2020-05-26 ENCOUNTER — Other Ambulatory Visit: Payer: Self-pay | Admitting: Family Medicine

## 2020-05-26 ENCOUNTER — Ambulatory Visit: Payer: BC Managed Care – PPO | Admitting: Family Medicine

## 2020-05-26 ENCOUNTER — Other Ambulatory Visit: Payer: Self-pay

## 2020-05-26 ENCOUNTER — Encounter: Payer: Self-pay | Admitting: Family Medicine

## 2020-05-26 VITALS — BP 120/63 | HR 64 | Temp 97.5°F | Ht 62.4 in | Wt 156.3 lb

## 2020-05-26 DIAGNOSIS — R0609 Other forms of dyspnea: Secondary | ICD-10-CM | POA: Insufficient documentation

## 2020-05-26 DIAGNOSIS — R0600 Dyspnea, unspecified: Secondary | ICD-10-CM | POA: Insufficient documentation

## 2020-05-26 DIAGNOSIS — M7989 Other specified soft tissue disorders: Secondary | ICD-10-CM

## 2020-05-26 DIAGNOSIS — R002 Palpitations: Secondary | ICD-10-CM

## 2020-05-26 DIAGNOSIS — D1724 Benign lipomatous neoplasm of skin and subcutaneous tissue of left leg: Secondary | ICD-10-CM

## 2020-05-26 DIAGNOSIS — R221 Localized swelling, mass and lump, neck: Secondary | ICD-10-CM | POA: Diagnosis not present

## 2020-05-26 HISTORY — DX: Other forms of dyspnea: R06.09

## 2020-05-26 HISTORY — DX: Dyspnea, unspecified: R06.00

## 2020-05-26 NOTE — Progress Notes (Signed)
Tiffany Ross - 60 y.o. female MRN [id number]  Date of birth: [birth year]  Subjective Chief Complaint  Patient presents with  . Mass  . Palpitations    HPI Tiffany Ross is a 60 y.o. female here today for initial visit to establish care.  She  Has been fairly healthy. She has concerns about intermittent chest pain and palpitations.  Chest pain has occurred most often during exercise.  She does feel shortness of breath with this as well.  Symptoms improve with rest.  She does have sensation of palpitations at time as well.  She denies dizziness, nausea or vomiting, fatigue.  She also has several soft tissue masses noted along neck, legs and arm.  Present for several years and hasn't really changed.  Denies pain associated with these.  ROS:  A comprehensive ROS was completed and negative except as noted per HPI  Allergies  Allergen Reactions  . Codeine Other (See Comments)    Violent nightmares  . Sulfa Antibiotics Rash    Past Medical History:  Diagnosis Date  . Anxiety     Past Surgical History:  Procedure Laterality Date  . ABDOMINAL HYSTERECTOMY    . CESAREAN SECTION    . KNEE SURGERY      Social History   Socioeconomic History  . Marital status: Married    Spouse name: Not on file  . Number of children: Not on file  . Years of education: Not on file  . Highest education level: Not on file  Occupational History  . Occupation: Homemaker  Tobacco Use  . Smoking status: Never Smoker  . Smokeless tobacco: Never Used  Vaping Use  . Vaping Use: Never used  Substance and Sexual Activity  . Alcohol use: Yes    Alcohol/week: 10.0 standard drinks    Types: 10 Glasses of wine per week    Comment: weekly  . Drug use: Never  . Sexual activity: Yes    Partners: Male    Birth control/protection: Surgical  Other Topics Concern  . Not on file  Social History Narrative  . Not on file   Social Determinants of Health   Financial Resource Strain: Not on file  Food  Insecurity: Not on file  Transportation Needs: Not on file  Physical Activity: Not on file  Stress: Not on file  Social Connections: Not on file    Family History  Problem Relation Age of Onset  . Hypertension Mother   . Diabetes Mother   . Kidney cancer Mother   . Ovarian cancer Sister   . Hypertension Brother   . Alzheimer's disease Father     Health Maintenance  Topic Date Due  . HIV Screening  Never done  . Hepatitis C Screening  Never done  . COLONOSCOPY (Pts 45-22yrs Insurance coverage will need to be confirmed)  Never done  . MAMMOGRAM  Never done  . PAP SMEAR-Modifier  06/14/2017  . INFLUENZA VACCINE  08/02/2020  . TETANUS/TDAP  10/28/2025  . HPV VACCINES  Aged Out     ----------------------------------------------------------------------------------------------------------------------------------------------------------------------------------------------------------------- Physical Exam BP 120/63 (BP Location: Left Arm, Patient Position: Sitting, Cuff Size: Normal)   Pulse 64   Temp (!) 97.5 F (36.4 C)   Ht 5' 2.4" (1.585 m)   Wt 156 lb 4.8 oz (70.9 kg)   SpO2 100%   BMI 28.22 kg/m   Physical Exam Constitutional:      Appearance: Normal appearance.  HENT:     Head: Normocephalic and atraumatic.  Eyes:  General: No scleral icterus. Cardiovascular:     Rate and Rhythm: Normal rate and regular rhythm.  Pulmonary:     Effort: Pulmonary effort is normal.     Breath sounds: Normal breath sounds.  Musculoskeletal:     Cervical back: Neck supple.  Skin:    Comments: Soft tissue nodules that are soft and rubbery located on the arms and legs.  Small areas in supraclavicular fossa as well.   Neurological:     General: No focal deficit present.     Mental Status: She is alert.  Psychiatric:        Mood and Affect: Mood normal.        Behavior: Behavior normal.      ------------------------------------------------------------------------------------------------------------------------------------------------------------------------------------------------------------------- Assessment and Plan  Lipoma of left lower extremity Multiple soft tissue nodules consistent with lipomas on arms and legs.  She also has a couple of areas in supraclavicular fossa.  [place] ordered for further evaluation of these areas.    Exertional dyspnea She is having some intermittent chest tightness with exertional dyspnea.  Also with some palpitations.  EKG with sinus bradycardia Checking TSH, CMP and CBC Referral placed to cardiology.    No orders of the defined types were placed in this encounter.   No follow-ups on file.    This visit occurred during the SARS-CoV-2 public health emergency.  Safety protocols were in place, including screening questions prior to the visit, additional usage of staff PPE, and extensive cleaning of exam room while observing appropriate contact time as indicated for disinfecting solutions.

## 2020-05-26 NOTE — Patient Instructions (Signed)
Great to meet you today! Have labs completed.  [place] of neck ordered.  I have placed a referral to cardiology, you will be contacted for appt.

## 2020-05-26 NOTE — Assessment & Plan Note (Signed)
Multiple soft tissue nodules consistent with lipomas on arms and legs.  She also has a couple of areas in supraclavicular fossa.  [place] ordered for further evaluation of these areas.

## 2020-05-26 NOTE — Assessment & Plan Note (Signed)
She is having some intermittent chest tightness with exertional dyspnea.  Also with some palpitations.  EKG with sinus bradycardia Checking TSH, CMP and CBC Referral placed to cardiology.

## 2020-05-27 LAB — COMPLETE METABOLIC PANEL WITH GFR
AG Ratio: 1.8 (calc) (ref 1.0–2.5)
ALT: 15 U/L (ref 6–29)
AST: 17 U/L (ref 10–35)
Albumin: 4.4 g/dL (ref 3.6–5.1)
Alkaline phosphatase (APISO): 63 U/L (ref 37–153)
BUN: 17 mg/dL (ref 7–25)
CO2: 30 mmol/L (ref 20–32)
Calcium: 9.9 mg/dL (ref 8.6–10.4)
Chloride: 102 mmol/L (ref 98–110)
Creat: 0.9 mg/dL (ref 0.50–0.99)
GFR, Est African American: 81 mL/min/{1.73_m2} (ref >=60)
GFR, Est Non African American: 69 mL/min/{1.73_m2} (ref >=60)
Globulin: 2.4 g/dL (calc) (ref 1.9–3.7)
Glucose, Bld: 99 mg/dL (ref 65–99)
Potassium: 4.8 mmol/L (ref 3.5–5.3)
Sodium: 138 mmol/L (ref 135–146)
Total Bilirubin: 0.6 mg/dL (ref 0.2–1.2)
Total Protein: 6.8 g/dL (ref 6.1–8.1)

## 2020-05-27 LAB — CBC WITH DIFFERENTIAL/PLATELET
Absolute Monocytes: 508 cells/uL (ref 200–950)
Basophils Absolute: 19 cells/uL (ref 0–200)
Basophils Relative: 0.3 %
Eosinophils Absolute: 37 cells/uL (ref 15–500)
Eosinophils Relative: 0.6 %
HCT: 40.2 % (ref 35.0–45.0)
Hemoglobin: 13.5 g/dL (ref 11.7–15.5)
Lymphs Abs: 1823 cells/uL (ref 850–3900)
MCH: 29.8 pg (ref 27.0–33.0)
MCHC: 33.6 g/dL (ref 32.0–36.0)
MCV: 88.7 fL (ref 80.0–100.0)
MPV: 11.1 fL (ref 7.5–12.5)
Monocytes Relative: 8.2 %
Neutro Abs: 3813 cells/uL (ref 1500–7800)
Neutrophils Relative %: 61.5 %
Platelets: 293 10*3/uL (ref 140–400)
RBC: 4.53 10*6/uL (ref 3.80–5.10)
RDW: 11.7 % (ref 11.0–15.0)
Total Lymphocyte: 29.4 %
WBC: 6.2 10*3/uL (ref 3.8–10.8)

## 2020-05-27 LAB — TSH: TSH: 0.73 mIU/L (ref 0.40–4.50)

## 2020-05-27 LAB — T4, FREE: Free T4: 1 ng/dL (ref 0.8–1.8)

## 2020-06-02 NOTE — Addendum Note (Signed)
Addended by: Narda Rutherford on: 06/02/2020 03:37 PM   Modules accepted: Orders

## 2020-06-08 ENCOUNTER — Encounter: Payer: Self-pay | Admitting: Family Medicine

## 2020-06-28 DIAGNOSIS — F419 Anxiety disorder, unspecified: Secondary | ICD-10-CM | POA: Insufficient documentation

## 2020-06-29 ENCOUNTER — Telehealth: Payer: Self-pay

## 2020-06-29 ENCOUNTER — Encounter: Payer: Self-pay | Admitting: Cardiology

## 2020-06-29 ENCOUNTER — Ambulatory Visit: Payer: BC Managed Care – PPO | Admitting: Cardiology

## 2020-06-29 ENCOUNTER — Ambulatory Visit (INDEPENDENT_AMBULATORY_CARE_PROVIDER_SITE_OTHER): Payer: BC Managed Care – PPO

## 2020-06-29 ENCOUNTER — Other Ambulatory Visit: Payer: Self-pay

## 2020-06-29 VITALS — BP 126/74 | HR 58 | Ht 63.0 in | Wt 156.0 lb

## 2020-06-29 DIAGNOSIS — R002 Palpitations: Secondary | ICD-10-CM | POA: Insufficient documentation

## 2020-06-29 DIAGNOSIS — R0600 Dyspnea, unspecified: Secondary | ICD-10-CM | POA: Diagnosis not present

## 2020-06-29 DIAGNOSIS — I700 Atherosclerosis of aorta: Secondary | ICD-10-CM

## 2020-06-29 DIAGNOSIS — R0609 Other forms of dyspnea: Secondary | ICD-10-CM

## 2020-06-29 HISTORY — DX: Atherosclerosis of aorta: I70.0

## 2020-06-29 HISTORY — DX: Palpitations: R00.2

## 2020-06-29 NOTE — Telephone Encounter (Signed)
Left a message on pt's vm to see if she would like [place] to schedule her stress echo at [facility] as [facility] is scheduling into August.

## 2020-06-29 NOTE — Progress Notes (Signed)
Cardiology Office Note:    Date:  06/29/2020   ID:  Tiffany Ross, DOB [birth year], MRN [id number]  PCP:  Luetta Nutting, DO  Cardiologist:  Jenean Lindau, MD   Referring MD: Luetta Nutting, DO    ASSESSMENT:    1. Exertional dyspnea   2. Aortic atherosclerosis (HCC)   3. Palpitations    PLAN:    In order of problems listed above:  Primary prevention stressed with the patient.  Importance of compliance with diet medication stressed and she vocalized understanding. Dyspnea on exertion: I will evaluate this with an exercise stress echo.  She has risk factors for atherosclerosis.  She is agreeable. Aortic atherosclerosis: I discussed this with the patient at length.  This was found on CT of the abdomen for renal stone.  I will assess liver lipid check and she will need to be on statin therapy. Palpitations: Her TSH is fine.  We will do a 2-week monitor. Coronary risk stratification: She has family history of coronary artery disease so we will do a calcium scoring and she is agreeable. Patient will be seen in follow-up appointment in 6 months or earlier if the patient has any concerns    Medication Adjustments/Labs and Tests Ordered: Current medicines are reviewed at length with the patient today.  Concerns regarding medicines are outlined above.  No orders of the defined types were placed in this encounter.  No orders of the defined types were placed in this encounter.    History of Present Illness:    Tiffany Ross is a 60 y.o. female who is being seen today for the evaluation of palpitations at the request of Luetta Nutting, DO.  Patient is a pleasant 60 year old female.  She has past medical history of aortic atherosclerosis.  She mentions to me that he has palpitations on and off.  She is to have significant amount of alcohol consumption on a regular basis but stopped because of palpitations and they are much better.  No chest pain orthopnea or PND.  At the time of my  evaluation, the patient is alert awake oriented and in no distress.  She has noticed dyspnea on exertion.  Past Medical History:  Diagnosis Date   Anxiety    Exertional dyspnea 05/26/2020   Lipoma of left lower extremity 04/14/2016   Osteoarthritis of right hip 02/07/2017   Sebaceous cyst 03/27/2016    Past Surgical History:  Procedure Laterality Date   ABDOMINAL HYSTERECTOMY     CESAREAN SECTION     KNEE SURGERY      Current Medications: Current Meds  Medication Sig   Calcium Carb-Cholecalciferol (CALCIUM 1000 + D PO) Take 1 tablet by mouth daily.   cetirizine (ZYRTEC) 10 MG tablet Take 10 mg by mouth as needed for allergies.   estradiol (VIVELLE-DOT) 0.1 MG/24HR patch Place 1 patch onto the skin 2 (two) times a week.   FLUoxetine (PROZAC) 20 MG capsule Take 20 mg by mouth daily.   Omega-3 Fatty Acids (FISH OIL) 1200 MG CAPS Take 1,200 mg by mouth daily.     Allergies:   Codeine and Sulfa antibiotics   Social History   Socioeconomic History   Marital status: Married    Spouse name: Not on file   Number of children: Not on file   Years of education: Not on file   Highest education level: Not on file  Occupational History   Occupation: Homemaker  Tobacco Use   Smoking status: Never   Smokeless tobacco:  Never  Vaping Use   Vaping Use: Never used  Substance and Sexual Activity   Alcohol use: Yes    Alcohol/week: 10.0 standard drinks    Types: 10 Glasses of wine per week    Comment: weekly   Drug use: Never   Sexual activity: Yes    Partners: Male    Birth control/protection: Surgical  Other Topics Concern   Not on file  Social History Narrative   Not on file   Social Determinants of Health   Financial Resource Strain: Not on file  Food Insecurity: Not on file  Transportation Needs: Not on file  Physical Activity: Not on file  Stress: Not on file  Social Connections: Not on file     Family History: The patient's family history includes Alzheimer's disease  in her father; Diabetes in her mother; Hypertension in her brother and mother; Kidney cancer in her mother; Ovarian cancer in her sister.  ROS:   Please see the history of present illness.    All other systems reviewed and are negative.  EKGs/Labs/Other Studies Reviewed:    The following studies were reviewed today: I discussed my findings with the patient at length.  EKG reveals sinus rhythm and nonspecific ST-T changes.   Recent Labs: 05/26/2020: ALT 15; BUN 17; Creat 0.90; Hemoglobin 13.5; Platelets 293; Potassium 4.8; Sodium 138; TSH 0.73  Recent Lipid Panel No results found for: CHOL, TRIG, HDL, CHOLHDL, VLDL, LDLCALC, LDLDIRECT  Physical Exam:    VS:  BP 126/74   Pulse (!) 58   Ht 5\' 3"  (1.6 m)   Wt 156 lb 0.6 oz (70.8 kg)   SpO2 99%   BMI 27.64 kg/m     Wt Readings from Last 3 Encounters:  06/29/20 156 lb 0.6 oz (70.8 kg)  05/26/20 156 lb 4.8 oz (70.9 kg)  08/31/19 157 lb (71.2 kg)     GEN: Patient is in no acute distress HEENT: Normal NECK: No JVD; No carotid bruits LYMPHATICS: No lymphadenopathy CARDIAC: S1 S2 regular, 2/6 systolic murmur at the apex. RESPIRATORY:  Clear to auscultation without rales, wheezing or rhonchi  ABDOMEN: Soft, non-tender, non-distended MUSCULOSKELETAL:  No edema; No deformity  SKIN: Warm and dry NEUROLOGIC:  Alert and oriented x 3 PSYCHIATRIC:  Normal affect    Signed, Jenean Lindau, MD  06/29/2020 10:28 AM    [facility]

## 2020-06-29 NOTE — Patient Instructions (Addendum)
Medication Instructions:  No medication changes. *If you need a refill on your cardiac medications before your next appointment, please call your pharmacy*   Lab Work: Your physician recommends that you return for lab work in: the next few days. You need to have labs done when you are fasting.  You can come Monday through Friday 8:30 am to 12:00 pm and 1:15 to 4:30. You do not need to make an appointment as the order has already been placed. The labs you are going to have done are BMET, LFT and Lipids.   If you have labs (blood work) drawn today and your tests are completely normal, you will receive your results only by: [place] (if you have MyChart) OR A paper copy in the mail If you have any lab test that is abnormal or we need to change your treatment, we will call you to review the results.   Testing/Procedures:  We will order CT coronary calcium score. It will cost $99.00 and is not covered by insurance.  Please call [phone] to schedule.   [facility]  [address] [place], [place] [address]  Your physician has requested that you have an echocardiogram. Echocardiography is a painless test that uses sound waves to create images of your heart. It provides your doctor with information about the size and shape of your heart and how well your heart's chambers and valves are working. This procedure takes approximately one hour. There are no restrictions for this procedure.   WHY IS MY DOCTOR PRESCRIBING ZIO? The Zio system is proven and trusted by physicians to detect and diagnose irregular heart rhythms -- and has been prescribed to hundreds of thousands of patients.  The FDA has cleared the Zio system to monitor for many different kinds of irregular heart rhythms. In a study, physicians were able to reach a diagnosis 90% of the time with the Zio system1.  You can wear the Zio monitor -- a small, discreet, comfortable patch -- during your normal day-to-day  activity, including while you sleep, shower, and exercise, while it records every single heartbeat for analysis.  1Barrett, P., et al. Comparison of 24 Hour Holter Monitoring Versus 14 Day Novel Adhesive Patch Electrocardiographic Monitoring. [place], 2014.  ZIO VS. HOLTER MONITORING The Zio monitor can be comfortably worn for up to 14 days. Holter monitors can be worn for 24 to 48 hours, limiting the time to record any irregular heart rhythms you may have. Zio is able to capture data for the 51% of patients who have their first symptom-triggered arrhythmia after 48 hours.1  LIVE WITHOUT RESTRICTIONS The Zio ambulatory cardiac monitor is a small, unobtrusive, and water-resistant patch--you might even forget you're wearing it. The Zio monitor records and stores every beat of your heart, whether you're sleeping, working out, or showering. Wear the monitor for 14 days, remove 07/13/20.      Stress Echocardiogram Information Sheet                                                      Instructions:    1. You may take your morning medications the morning of the test  2. Light breakfast no caffeine  3. Dress prepared to exercise.  4. DO NOT use ANY caffeine or tobacco products 3 hours before  appointment.  5. Please bring all current prescription medications.   Follow-Up: At [facility], you and your health needs are our priority.  As part of our continuing mission to provide you with exceptional heart care, we have created designated Provider Care Teams.  These Care Teams include your primary Cardiologist (physician) and Advanced Practice Providers (APPs -  Physician Assistants and Nurse Practitioners) who all work together to provide you with the care you need, when you need it.  We recommend signing up for the patient portal called "MyChart".  Sign up information is provided on this After Visit Summary.  MyChart is used to connect with patients for Virtual Visits  (Telemedicine).  Patients are able to view lab/test results, encounter notes, upcoming appointments, etc.  Non-urgent messages can be sent to your provider as well.   To learn more about what you can do with MyChart, go to [email or web address].    Your next appointment:   110month(s)  The format for your next appointment:   In Person  Provider:   Jyl Heinz, MD   Other Instructions Exercise Stress Echocardiogram An exercise stress echocardiogram is a test to check how well your heart is working. This test uses sound waves and a computer to make pictures of yourheart. These pictures will be taken before and after you exercise. For this test, you will walk on a treadmill or ride a bicycle to make your heart beat faster. While you exercise, your heart will be checked with anelectrocardiogram (ECG). Your blood pressure will also be checked. You may have this test if: You have chest pain or a heart problem. You had a heart attack or heart surgery not long ago. You have heart valve problems. You have a condition that causes narrowing of the blood vessels that supply your heart. You have a high risk of heart disease and: You are starting a new exercise program. You need to have a big surgery. Tell a doctor about: Any allergies you have. All medicines you are taking. This includes vitamins, herbs, eye drops, creams, and over-the-counter medicines. Any problems you or family members have had with medicines that make you fall asleep (anesthetic medicines). Any surgeries you have had. Any blood disorders you have. Any medical conditions you have. Whether you are pregnant or may be pregnant. What are the risks? Generally, this is a safe test. However, problems may occur, including: Chest pain. Feeling dizzy or light-headed. Shortness of breath. Increased or irregular heartbeat. Feeling like you may vomit (nausea) or vomiting. Heart attack. This is very rare. What happens before the  test? Medicines Ask your doctor about changing or stopping your normal medicines. This is important if you take diabetes medicines or blood thinners. If you use an inhaler, bring it to the test. General instructions Wear comfortable clothes and walking shoes. Follow instructions from your doctor about what you cannot eat or drink before the test. Do not drink or eat anything that has caffeine in it. Stop having caffeine 24 hours before the test. Do not smoke or use products that contain nicotine or tobacco for 4 hours before the test. If you need help quitting, ask your doctor. What happens during the test?  You will take off your clothes from the waist up and put on a hospital gown. Electrodes or patches will be put on your chest. A blood pressure cuff will be put on your arm. Before you exercise, a computer will make a picture of your heart. To do this: You will  lie down and a gel will be put on your chest. A wand will be moved over the gel. Sound waves from the wand will go to the computer to make the picture. Then, you will start to exercise. You may walk on a treadmill or pedal a bicycle. Your blood pressure and heart rhythm will be checked while you exercise. The exercise will get harder or faster. You will exercise until: Your heart reaches a certain level. You are too tired to go on. You cannot go on because of chest pain, weakness, or dizziness. You will lie down right away so another picture of your heart can be taken. The procedure may vary among doctors and hospitals. What can I expect after the test? After your test, it is common to have: Mild soreness. Mild tiredness. Your heart rate and blood pressure will be checked until they return to yournormal levels. You should not have any new symptoms after this test. Follow these instructions at home: If your doctor says that you can, you may: Eat what you normally eat. Do your normal activities. Take over-the-counter and  prescription medicines only as told by your doctor. Keep all follow-up visits. It is up to you to get the results of your test. Ask how to get your results when they are ready. Contact a doctor if: You feel dizzy or light-headed. You have a fast or irregular heartbeat. You feel like you may vomit or you vomit. You have a headache. You feel short of breath. Get help right away if: You develop pain or pressure: In your chest. In your jaw or neck. Between your shoulders. That goes down your left arm. You faint. You have trouble breathing. These symptoms may be an emergency. Get medical help right away. Call your local emergency services (911 in the [place] Do not wait to see if the symptoms will go away. Do not drive yourself to the hospital. Summary This is a test that checks how well your heart is working. Follow instructions about what you cannot eat or drink before the test. Ask your doctor if you should take your normal medicines before the test. Stop having caffeine 24 hours before the test. Do not smoke or use products with nicotine or tobacco in them for 4 hours before the test. During the test, your blood pressure and heart rhythm will be checked while you exercise. This information is not intended to replace advice given to you by your health care provider. Make sure you discuss any questions you have with your healthcare provider. Document Revised: 08/12/2019 Document Reviewed: 08/12/2019 Elsevier Patient Education  2022 [place].  Coronary Calcium Scan A coronary calcium scan is an imaging test used to look for deposits of plaque in the inner lining of the blood vessels of the heart (coronary arteries). Plaque is made up of calcium, protein, and fatty substances. These deposits of plaque can partly clog and narrow the coronary arteries without producing any symptoms or warning signs. This puts a person at risk for a heart attack. This test is recommended for people who  are at moderate risk for heart disease. The test can find plaque deposits before symptoms develop. Tell a health care provider about: Any allergies you have. All medicines you are taking, including vitamins, herbs, eye drops, creams, and over-the-counter medicines. Any problems you or family members have had with anesthetic medicines. Any blood disorders you have. Any surgeries you have had. Any medical conditions you have. Whether you are pregnant or may  be pregnant. What are the risks? Generally, this is a safe procedure. However, problems may occur, including: Harm to a pregnant woman and her unborn baby. This test involves the use of radiation. Radiation exposure can be dangerous to a pregnant woman and her unborn baby. If you are pregnant or think you may be pregnant, you should not have this procedure done. Slight increase in the risk of cancer. This is because of the radiation involved in the test. What happens before the procedure? Ask your health care provider for any specific instructions on how to prepare for this procedure. You may be asked to avoid products that contain caffeine, tobacco, or nicotine for 4 hours before the procedure. What happens during the procedure? You will undress and remove any jewelry from your neck or chest. You will put on a hospital gown. Sticky electrodes will be placed on your chest. The electrodes will be connected to an electrocardiogram (ECG) machine to record a tracing of the electrical activity of your heart. You will lie down on a curved bed that is attached to the [place]. You may be given medicine to slow down your heart rate so that clear pictures can be created. You will be moved into the CT scanner, and the CT scanner will take pictures of your heart. During this time, you will be asked to lie still and hold your breath for 2-3 seconds at a time while each picture of your heart is being taken. The procedure may vary among health care  providers and hospitals.    What happens after the procedure? You can get dressed. You can return to your normal activities. It is up to you to get the results of your procedure. Ask your health care provider, or the department that is doing the procedure, when your results will be ready. Summary A coronary calcium scan is an imaging test used to look for deposits of plaque in the inner lining of the blood vessels of the heart (coronary arteries). Plaque is made up of calcium, protein, and fatty substances. Generally, this is a safe procedure. Tell your health care provider if you are pregnant or may be pregnant. Ask your health care provider for any specific instructions on how to prepare for this procedure. A CT scanner will take pictures of your heart. You can return to your normal activities after the scan is done. This information is not intended to replace advice given to you by your health care provider. Make sure you discuss any questions you have with your health care provider. Document Revised: 07/09/2018 Document Reviewed: 07/09/2018 Elsevier Patient Education  2021 [place].  Echocardiogram An echocardiogram is a test that uses sound waves (ultrasound) to produce images of the heart. Images from an echocardiogram can provide important information about: Heart size and shape. The size and thickness and movement of your heart's walls. Heart muscle function and strength. Heart valve function or if you have stenosis. Stenosis is when the heart valves are too narrow. If blood is flowing backward through the heart valves (regurgitation). A tumor or infectious growth around the heart valves. Areas of heart muscle that are not working well because of poor blood flow or injury from a heart attack. Aneurysm detection. An aneurysm is a weak or damaged part of an artery wall. The wall bulges out from the normal force of blood pumping through the body. Tell a health care provider  about: Any allergies you have. All medicines you are taking, including vitamins, herbs,  eye drops, creams, and over-the-counter medicines. Any blood disorders you have. Any surgeries you have had. Any medical conditions you have. Whether you are pregnant or may be pregnant. What are the risks? Generally, this is a safe test. However, problems may occur, including an allergic reaction to dye (contrast) that may be used during the test. What happens before the test? No specific preparation is needed. You may eat and drink normally. What happens during the test?  You will take off your clothes from the waist up and put on a hospital gown. Electrodes or electrocardiogram (ECG)patches may be placed on your chest. The electrodes or patches are then connected to a device that monitors your heart rate and rhythm. You will lie down on a table for an ultrasound exam. A gel will be applied to your chest to help sound waves pass through your skin. A handheld device, called a transducer, will be pressed against your chest and moved over your heart. The transducer produces sound waves that travel to your heart and bounce back (or "echo" back) to the transducer. These sound waves will be captured in real-time and changed into images of your heart that can be viewed on a video monitor. The images will be recorded on a computer and reviewed by your health care provider. You may be asked to change positions or hold your breath for a short time. This makes it easier to get different views or better views of your heart. In some cases, you may receive contrast through an IV in one of your veins. This can improve the quality of the pictures from your heart. The procedure may vary among health care providers and hospitals. What can I expect after the test? You may return to your normal, everyday life, including diet, activities, andmedicines, unless your health care provider tells you not to do that. Follow these  instructions at home: It is up to you to get the results of your test. Ask your health care provider, or the department that is doing the test, when your results will be ready. Keep all follow-up visits. This is important. Summary An echocardiogram is a test that uses sound waves (ultrasound) to produce images of the heart. Images from an echocardiogram can provide important information about the size and shape of your heart, heart muscle function, heart valve function, and other possible heart problems. You do not need to do anything to prepare before this test. You may eat and drink normally. After the echocardiogram is completed, you may return to your normal, everyday life, unless your health care provider tells you not to do that. This information is not intended to replace advice given to you by your health care provider. Make sure you discuss any questions you have with your healthcare provider. Document Revised: 08/12/2019 Document Reviewed: 08/12/2019 Elsevier Patient Education  2022 [employer].

## 2020-07-01 DIAGNOSIS — I700 Atherosclerosis of aorta: Secondary | ICD-10-CM | POA: Diagnosis not present

## 2020-07-02 LAB — BASIC METABOLIC PANEL
BUN/Creatinine Ratio: 22 (ref 12–28)
BUN: 17 mg/dL (ref 8–27)
CO2: 26 mmol/L (ref 20–29)
Calcium: 9.3 mg/dL (ref 8.7–10.3)
Chloride: 101 mmol/L (ref 96–106)
Creatinine, Ser: 0.79 mg/dL (ref 0.57–1.00)
Glucose: 98 mg/dL (ref 65–99)
Potassium: 4.2 mmol/L (ref 3.5–5.2)
Sodium: 135 mmol/L (ref 134–144)
eGFR: 86 mL/min/{1.73_m2} (ref >59)

## 2020-07-02 LAB — HEPATIC FUNCTION PANEL
ALT: 17 IU/L (ref 0–32)
AST: 19 IU/L (ref 0–40)
Albumin: 4.4 g/dL (ref 3.8–4.9)
Alkaline Phosphatase: 79 IU/L (ref 44–121)
Bilirubin Total: 0.7 mg/dL (ref 0.0–1.2)
Bilirubin, Direct: 0.18 mg/dL (ref 0.00–0.40)
Total Protein: 6.4 g/dL (ref 6.0–8.5)

## 2020-07-02 LAB — LIPID PANEL
Chol/HDL Ratio: 2.6 ratio (ref 0.0–4.4)
Cholesterol, Total: 232 mg/dL — ABNORMAL HIGH (ref 100–199)
HDL: 88 mg/dL (ref >39)
LDL Chol Calc (NIH): 135 mg/dL — ABNORMAL HIGH (ref 0–99)
Triglycerides: 55 mg/dL (ref 0–149)
VLDL Cholesterol Cal: 9 mg/dL (ref 5–40)

## 2020-07-02 MED ORDER — ROSUVASTATIN CALCIUM 10 MG PO TABS: 10.0000 mg | ORAL_TABLET | Freq: Every day | ORAL | 3 refills | Status: DC

## 2020-07-02 NOTE — Addendum Note (Signed)
Addended by: Truddie Hidden on: 07/02/2020 02:50 PM   Modules accepted: Orders

## 2020-07-16 DIAGNOSIS — R002 Palpitations: Secondary | ICD-10-CM | POA: Diagnosis not present

## 2020-07-20 ENCOUNTER — Telehealth: Payer: Self-pay

## 2020-07-20 NOTE — Telephone Encounter (Signed)
Pt aware that her stress echo is scheduled for 07/22/20 at [facility].  Arrive at 8:30 for 9:00 stress. Message also sent in [place].

## 2020-07-22 ENCOUNTER — Encounter: Payer: Self-pay | Admitting: Cardiology

## 2020-07-22 DIAGNOSIS — R0600 Dyspnea, unspecified: Secondary | ICD-10-CM | POA: Diagnosis not present

## 2020-07-27 ENCOUNTER — Ambulatory Visit (INDEPENDENT_AMBULATORY_CARE_PROVIDER_SITE_OTHER)
Admission: RE | Admit: 2020-07-27 | Discharge: 2020-07-27 | Disposition: A | Discharge status: Home / Self Care | Payer: Self-pay | Source: Ambulatory Visit | Attending: Cardiology | Admitting: Cardiology

## 2020-07-27 ENCOUNTER — Other Ambulatory Visit: Payer: Self-pay

## 2020-07-27 DIAGNOSIS — I700 Atherosclerosis of aorta: Secondary | ICD-10-CM

## 2020-07-28 ENCOUNTER — Telehealth: Payer: Self-pay

## 2020-07-28 NOTE — Telephone Encounter (Signed)
Patient notified of results.

## 2020-07-28 NOTE — Telephone Encounter (Signed)
-----   Message from Jenean Lindau, MD sent at 07/27/2020 11:10 PM EDT ----- The results of the study is unremarkable. Please inform patient. I will discuss in detail at next appointment. [facility]  primary care/referring physician Jenean Lindau, MD 07/27/2020 11:10 PM

## 2020-07-30 ENCOUNTER — Other Ambulatory Visit: Payer: Self-pay

## 2020-07-30 ENCOUNTER — Ambulatory Visit: Payer: BC Managed Care – PPO | Admitting: Cardiology

## 2020-07-30 ENCOUNTER — Encounter: Payer: Self-pay | Admitting: Cardiology

## 2020-07-30 VITALS — BP 102/62 | HR 62 | Ht 62.0 in | Wt 156.1 lb

## 2020-07-30 DIAGNOSIS — I700 Atherosclerosis of aorta: Secondary | ICD-10-CM | POA: Diagnosis not present

## 2020-07-30 DIAGNOSIS — R002 Palpitations: Secondary | ICD-10-CM | POA: Diagnosis not present

## 2020-07-30 NOTE — Progress Notes (Signed)
Cardiology Office Note:    Date:  07/30/2020   ID:  Tiffany Ross, DOB [birth year], MRN [id number]  PCP:  Luetta Nutting, DO  Cardiologist:  Jenean Lindau, MD   Referring MD: Luetta Nutting, DO    ASSESSMENT:    1. Aortic atherosclerosis (HCC)   2. Palpitations    PLAN:    In order of problems listed above:  Aortic atherosclerosis: Secondary prevention stressed with the patient.  Importance of compliance with diet medication stressed and she vocalized understanding.  She promises to exercise regularly.  She was advised to walk at least half an hour a day 5 days a week and lose weight. Mixed dyslipidemia: Secondary prevention stressed.  I will initiate her on statin therapy in view of aortic atherosclerosis she is agreeable.  She will be back in 6 weeks for liver lipid check.  Diet was emphasized. Results of echocardiogram and event monitor was discussed with her at extensive length and questions were answered to her satisfaction. Patient will be seen in follow-up appointment in 6 months or earlier if the patient has any concerns    Medication Adjustments/Labs and Tests Ordered: Current medicines are reviewed at length with the patient today.  Concerns regarding medicines are outlined above.  No orders of the defined types were placed in this encounter.  No orders of the defined types were placed in this encounter.    No chief complaint on file.    History of Present Illness:    Tiffany Ross is a 60 y.o. female.  Patient has past medical history of aortic atherosclerosis and mixed dyslipidemia.  She is here for follow-up.  Her event monitor was overall unremarkable.  She has aortic atherosclerosis on the scan.  She denies any chest pain orthopnea or PND.  She is an active lady.  At the time of my evaluation, the patient is alert awake oriented and in no distress.  Past Medical History:  Diagnosis Date   Anxiety    Aortic atherosclerosis ([place]) 06/29/2020   Exertional  dyspnea 05/26/2020   Lipoma of left lower extremity 04/14/2016   Osteoarthritis of right hip 02/07/2017   Palpitations 06/29/2020   Sebaceous cyst 03/27/2016    Past Surgical History:  Procedure Laterality Date   ABDOMINAL HYSTERECTOMY     CESAREAN SECTION     KNEE SURGERY      Current Medications: Current Meds  Medication Sig   Calcium Carb-Cholecalciferol (CALCIUM 1000 + D PO) Take 1 tablet by mouth daily.   cetirizine (ZYRTEC) 10 MG tablet Take 10 mg by mouth as needed for allergies.   estradiol (VIVELLE-DOT) 0.1 MG/24HR patch Place 1 patch onto the skin 2 (two) times a week.   FLUoxetine (PROZAC) 20 MG capsule Take 20 mg by mouth daily.   Omega-3 Fatty Acids (FISH OIL) 1200 MG CAPS Take 1,200 mg by mouth daily.   rosuvastatin (CRESTOR) 10 MG tablet Take 1 tablet (10 mg total) by mouth daily.     Allergies:   Codeine and Sulfa antibiotics   Social History   Socioeconomic History   Marital status: Married    Spouse name: Not on file   Number of children: Not on file   Years of education: Not on file   Highest education level: Not on file  Occupational History   Occupation: Homemaker  Tobacco Use   Smoking status: Never   Smokeless tobacco: Never  Vaping Use   Vaping Use: Never used  Substance and Sexual Activity  Alcohol use: Yes    Alcohol/week: 10.0 standard drinks    Types: 10 Glasses of wine per week    Comment: weekly   Drug use: Never   Sexual activity: Yes    Partners: Male    Birth control/protection: Surgical  Other Topics Concern   Not on file  Social History Narrative   Not on file   Social Determinants of Health   Financial Resource Strain: Not on file  Food Insecurity: Not on file  Transportation Needs: Not on file  Physical Activity: Not on file  Stress: Not on file  Social Connections: Not on file     Family History: The patient's family history includes Alzheimer's disease in her father; Diabetes in her mother; Hypertension in her  brother and mother; Kidney cancer in her mother; Ovarian cancer in her sister.  ROS:   Please see the history of present illness.    All other systems reviewed and are negative.  EKGs/Labs/Other Studies Reviewed:    The following studies were reviewed today: CLINICAL DATA:  Cardiovascular Disease Risk stratification   EXAM: Coronary Calcium Score   TECHNIQUE: A gated, non-contrast computed tomography scan of the heart was performed using 44m slice thickness. Axial images were analyzed on a dedicated workstation. Calcium scoring of the coronary arteries was performed using the Agatston method.   FINDINGS: Coronary arteries: Normal origins.   Coronary Calcium Score:   Left main: 0   Left anterior descending artery: 0   Left circumflex artery: 0   Right coronary artery: 0   Total: 0   Percentile:   Pericardium: Normal.   Ascending Aorta: Normal caliber.   [facility]-cardiac: [facility]   IMPRESSION: Coronary calcium score of 0.   Vascular/Lymphatic: Aortic atherosclerosis. No enlarged abdominal or pelvic lymph nodes.   Study Highlights    Patch Wear Time:  14 days and 0 hours (2022-06-28T10:45:34-0400 to 2022-07-12T10:45:38-0400)   Patient had a min HR of 36 bpm, max HR of 164 bpm, and avg HR of 60 bpm.    Predominant underlying rhythm was Sinus Rhythm.   4 Supraventricular Tachycardia runs occurred, the run with the fastest interval lasting 5 beats with a max rate of 164 bpm, the longest lasting 13 beats with an avg rate of 122 bpm. Junctional Rhythm/Junctional Tachycardia was present. Junctional Rhythm/Junctional Tachycardia was detected within +/- 45 seconds of symptomatic patient event(s). Isolated SVEs were rare (<1.0%), SVE Couplets were rare (<1.0%), and SVE Triplets were rare (<1.0%).    Isolated VEs were rare (<1.0%), VE Couplets were rare (<1.0%), and no VE Triplets were present. Ventricular Bigeminy was present.  Difficulty discerning atrial activity making definitive diagnosis difficult to ascertain. Inverted QRS complexes possibly due to inverted placement of device.   Impression: Mildly abnormal but overall unremarkable monitor.  Symptoms did not correlate with any significant findings.  Medical management.  Will discuss more at appointment.   Recent Labs: 05/26/2020: Hemoglobin 13.5; Platelets 293; TSH 0.73 07/01/2020: ALT 17; BUN 17; Creatinine, Ser 0.79; Potassium 4.2; Sodium 135  Recent Lipid Panel    Component Value Date/Time   CHOL 232 (H) 07/01/2020 1038   TRIG 55 07/01/2020 1038   HDL 88 07/01/2020 1038   CHOLHDL 2.6 07/01/2020 1038   LDLCALC 135 (H) 07/01/2020 1038    Physical Exam:    VS:  BP 102/62   Pulse 62   Ht '5\' 2"'$  (1.575 m)   Wt 156 lb 1.9 oz (70.8 kg)   SpO2  98%   BMI 28.55 kg/m     Wt Readings from Last 3 Encounters:  07/30/20 156 lb 1.9 oz (70.8 kg)  06/29/20 156 lb 0.6 oz (70.8 kg)  05/26/20 156 lb 4.8 oz (70.9 kg)     GEN: Patient is in no acute distress HEENT: Normal NECK: No JVD; No carotid bruits LYMPHATICS: No lymphadenopathy CARDIAC: Hear sounds regular, 2/6 systolic murmur at the apex. RESPIRATORY:  Clear to auscultation without rales, wheezing or rhonchi  ABDOMEN: Soft, non-tender, non-distended MUSCULOSKELETAL:  No edema; No deformity  SKIN: Warm and dry NEUROLOGIC:  Alert and oriented x 3 PSYCHIATRIC:  Normal affect   Signed, Jenean Lindau, MD  07/30/2020 4:10 PM    [facility]

## 2020-07-30 NOTE — Patient Instructions (Signed)

## 2020-08-19 DIAGNOSIS — I700 Atherosclerosis of aorta: Secondary | ICD-10-CM | POA: Diagnosis not present

## 2020-08-20 LAB — LIPID PANEL
Chol/HDL Ratio: 2.1 ratio (ref 0.0–4.4)
Cholesterol, Total: 161 mg/dL (ref 100–199)
HDL: 76 mg/dL (ref >39)
LDL Chol Calc (NIH): 75 mg/dL (ref 0–99)
Triglycerides: 48 mg/dL (ref 0–149)
VLDL Cholesterol Cal: 10 mg/dL (ref 5–40)

## 2020-08-20 LAB — HEPATIC FUNCTION PANEL
ALT: 20 IU/L (ref 0–32)
AST: 22 IU/L (ref 0–40)
Albumin: 4.4 g/dL (ref 3.8–4.9)
Alkaline Phosphatase: 66 IU/L (ref 44–121)
Bilirubin Total: 0.8 mg/dL (ref 0.0–1.2)
Bilirubin, Direct: 0.24 mg/dL (ref 0.00–0.40)
Total Protein: 6.5 g/dL (ref 6.0–8.5)

## 2020-08-24 ENCOUNTER — Other Ambulatory Visit (HOSPITAL_COMMUNITY): Payer: BC Managed Care – PPO

## 2020-11-15 DIAGNOSIS — Z01419 Encounter for gynecological examination (general) (routine) without abnormal findings: Secondary | ICD-10-CM | POA: Diagnosis not present

## 2020-11-15 DIAGNOSIS — Z6827 Body mass index (BMI) 27.0-27.9, adult: Secondary | ICD-10-CM | POA: Diagnosis not present

## 2020-11-15 DIAGNOSIS — Z1231 Encounter for screening mammogram for malignant neoplasm of breast: Secondary | ICD-10-CM | POA: Diagnosis not present

## 2020-11-17 ENCOUNTER — Other Ambulatory Visit: Payer: Self-pay | Admitting: Obstetrics & Gynecology

## 2020-11-17 DIAGNOSIS — R928 Other abnormal and inconclusive findings on diagnostic imaging of breast: Secondary | ICD-10-CM

## 2020-11-30 DIAGNOSIS — Z1211 Encounter for screening for malignant neoplasm of colon: Secondary | ICD-10-CM | POA: Diagnosis not present

## 2020-12-02 ENCOUNTER — Other Ambulatory Visit: Payer: Self-pay | Admitting: Obstetrics & Gynecology

## 2020-12-02 ENCOUNTER — Ambulatory Visit
Admission: RE | Admit: 2020-12-02 | Discharge: 2020-12-02 | Disposition: A | Discharge status: Home / Self Care | Payer: BC Managed Care – PPO | Source: Ambulatory Visit | Attending: Obstetrics & Gynecology | Admitting: Obstetrics & Gynecology

## 2020-12-02 DIAGNOSIS — R928 Other abnormal and inconclusive findings on diagnostic imaging of breast: Secondary | ICD-10-CM

## 2020-12-02 DIAGNOSIS — R921 Mammographic calcification found on diagnostic imaging of breast: Secondary | ICD-10-CM

## 2020-12-02 DIAGNOSIS — R922 Inconclusive mammogram: Secondary | ICD-10-CM | POA: Diagnosis not present

## 2020-12-05 LAB — COLOGUARD: COLOGUARD: NEGATIVE

## 2021-01-14 ENCOUNTER — Encounter: Payer: Self-pay | Admitting: Cardiology

## 2021-01-14 ENCOUNTER — Other Ambulatory Visit: Payer: Self-pay

## 2021-01-14 ENCOUNTER — Ambulatory Visit: Payer: BC Managed Care – PPO | Admitting: Cardiology

## 2021-01-14 VITALS — BP 114/74 | HR 68 | Ht 62.0 in | Wt 153.1 lb

## 2021-01-14 DIAGNOSIS — I700 Atherosclerosis of aorta: Secondary | ICD-10-CM

## 2021-01-14 DIAGNOSIS — E782 Mixed hyperlipidemia: Secondary | ICD-10-CM

## 2021-01-14 DIAGNOSIS — M1611 Unilateral primary osteoarthritis, right hip: Secondary | ICD-10-CM | POA: Diagnosis not present

## 2021-01-14 DIAGNOSIS — R002 Palpitations: Secondary | ICD-10-CM

## 2021-01-14 HISTORY — DX: Mixed hyperlipidemia: E78.2

## 2021-01-14 NOTE — Progress Notes (Signed)
Cardiology Office Note:    Date:  01/14/2021   ID:  Tiffany Ross, DOB 04-Dec-1960, MRN [id number]  PCP:  Luetta Nutting, DO  Cardiologist:  Jenean Lindau, MD   Referring MD: Luetta Nutting, DO    ASSESSMENT:    1. Aortic atherosclerosis (HCC)   2. Palpitations   3. Mixed dyslipidemia    PLAN:    In order of problems listed above:  Primary prevention stressed with the patient.  Importance of compliance with diet medication stressed and vocalized understanding.  She is advised to walk at least half an hour a day and she agrees to do so. Palpitations: These have resolved for most part and she is happy about it. Aortic atherosclerosis: Stable at this time.  She is on lipid-lowering medications. Mixed dyslipidemia: On statins.  She will have blood work today as she is fasting. Patient will be seen in follow-up appointment in 6 months or earlier if the patient has any concerns    Medication Adjustments/Labs and Tests Ordered: Current medicines are reviewed at length with the patient today.  Concerns regarding medicines are outlined above.  No orders of the defined types were placed in this encounter.  No orders of the defined types were placed in this encounter.    No chief complaint on file.    History of Present Illness:    Tiffany Ross is a 61 y.o. female.  Patient has past medical history of aortic atherosclerosis and mixed dyslipidemia.  She denies any problems at this time and takes care of activities of daily living.  No chest pain orthopnea or PND.  At the time of my evaluation, the patient is alert awake oriented and in no distress.  She exercises on a regular basis and showed me her records on her electronic wearable.   Past Medical History:  Diagnosis Date   Anxiety    Aortic atherosclerosis ([place]) 06/29/2020   Exertional dyspnea 05/26/2020   Lipoma of left lower extremity 04/14/2016   Osteoarthritis of right hip 02/07/2017   Palpitations 06/29/2020   Sebaceous  cyst 03/27/2016    Past Surgical History:  Procedure Laterality Date   ABDOMINAL HYSTERECTOMY     CESAREAN SECTION     KNEE SURGERY      Current Medications: Current Meds  Medication Sig   Calcium Carb-Cholecalciferol (CALCIUM 1000 + D PO) Take 1 tablet by mouth daily.   cetirizine (ZYRTEC) 10 MG tablet Take 10 mg by mouth as needed for allergies.   estradiol (VIVELLE-DOT) 0.1 MG/24HR patch Place 1 patch onto the skin 2 (two) times a week.   FLUoxetine (PROZAC) 20 MG capsule Take 20 mg by mouth daily.   Omega-3 Fatty Acids (FISH OIL) 1200 MG CAPS Take 1,200 mg by mouth daily.   rosuvastatin (CRESTOR) 10 MG tablet Take 10 mg by mouth 2 (two) times a week.   VITAMIN D PO Take 1 capsule by mouth daily.     Allergies:   Codeine and Sulfa antibiotics   Social History   Socioeconomic History   Marital status: Married    Spouse name: Not on file   Number of children: Not on file   Years of education: Not on file   Highest education level: Not on file  Occupational History   Occupation: Homemaker  Tobacco Use   Smoking status: Never   Smokeless tobacco: Never  Vaping Use   Vaping Use: Never used  Substance and Sexual Activity   Alcohol use: Yes  Alcohol/week: 10.0 standard drinks    Types: 10 Glasses of wine per week    Comment: weekly   Drug use: Never   Sexual activity: Yes    Partners: Male    Birth control/protection: Surgical  Other Topics Concern   Not on file  Social History Narrative   Not on file   Social Determinants of Health   Financial Resource Strain: Not on file  Food Insecurity: Not on file  Transportation Needs: Not on file  Physical Activity: Not on file  Stress: Not on file  Social Connections: Not on file     Family History: The patient's family history includes Alzheimer's disease in her father; Diabetes in her mother; Hypertension in her brother and mother; Kidney cancer in her mother; Ovarian cancer in her sister.  ROS:   Please see  the history of present illness.    All other systems reviewed and are negative.  EKGs/Labs/Other Studies Reviewed:    The following studies were reviewed today: I discussed my findings with the patient at length   Recent Labs: 05/26/2020: Hemoglobin 13.5; Platelets 293; TSH 0.73 07/01/2020: BUN 17; Creatinine, Ser 0.79; Potassium 4.2; Sodium 135 08/19/2020: ALT 20  Recent Lipid Panel    Component Value Date/Time   CHOL 161 08/19/2020 0934   TRIG 48 08/19/2020 0934   HDL 76 08/19/2020 0934   CHOLHDL 2.1 08/19/2020 0934   LDLCALC 75 08/19/2020 0934    Physical Exam:    VS:  BP 114/74    Pulse 68    Ht 5\' 2"  (1.575 m)    Wt 153 lb 1.3 oz (69.4 kg)    SpO2 97%    BMI 28.00 kg/m     Wt Readings from Last 3 Encounters:  01/14/21 153 lb 1.3 oz (69.4 kg)  07/30/20 156 lb 1.9 oz (70.8 kg)  06/29/20 156 lb 0.6 oz (70.8 kg)     GEN: Patient is in no acute distress HEENT: Normal NECK: No JVD; No carotid bruits LYMPHATICS: No lymphadenopathy CARDIAC: Hear sounds regular, 2/6 systolic murmur at the apex. RESPIRATORY:  Clear to auscultation without rales, wheezing or rhonchi  ABDOMEN: Soft, non-tender, non-distended MUSCULOSKELETAL:  No edema; No deformity  SKIN: Warm and dry NEUROLOGIC:  Alert and oriented x 3 PSYCHIATRIC:  Normal affect   Signed, Jenean Lindau, MD  01/14/2021 1:33 PM    [facility]

## 2021-01-14 NOTE — Patient Instructions (Signed)

## 2021-01-15 LAB — HEPATIC FUNCTION PANEL
ALT: 17 IU/L (ref 0–32)
AST: 18 IU/L (ref 0–40)
Albumin: 4.6 g/dL (ref 3.8–4.8)
Alkaline Phosphatase: 74 IU/L (ref 44–121)
Bilirubin Total: 0.9 mg/dL (ref 0.0–1.2)
Bilirubin, Direct: 0.21 mg/dL (ref 0.00–0.40)
Total Protein: 6.6 g/dL (ref 6.0–8.5)

## 2021-01-15 LAB — VITAMIN D 25 HYDROXY (VIT D DEFICIENCY, FRACTURES): Vit D, 25-Hydroxy: 46.2 ng/mL (ref 30.0–100.0)

## 2021-01-15 LAB — BASIC METABOLIC PANEL
BUN/Creatinine Ratio: 16 (ref 12–28)
BUN: 12 mg/dL (ref 8–27)
CO2: 26 mmol/L (ref 20–29)
Calcium: 9.9 mg/dL (ref 8.7–10.3)
Chloride: 102 mmol/L (ref 96–106)
Creatinine, Ser: 0.76 mg/dL (ref 0.57–1.00)
Glucose: 94 mg/dL (ref 70–99)
Potassium: 4.5 mmol/L (ref 3.5–5.2)
Sodium: 140 mmol/L (ref 134–144)
eGFR: 89 mL/min/{1.73_m2} (ref >59)

## 2021-01-15 LAB — LIPID PANEL
Chol/HDL Ratio: 2.4 ratio (ref 0.0–4.4)
Cholesterol, Total: 209 mg/dL — ABNORMAL HIGH (ref 100–199)
HDL: 87 mg/dL (ref >39)
LDL Chol Calc (NIH): 111 mg/dL — ABNORMAL HIGH (ref 0–99)
Triglycerides: 63 mg/dL (ref 0–149)
VLDL Cholesterol Cal: 11 mg/dL (ref 5–40)

## 2021-01-17 ENCOUNTER — Encounter: Payer: Self-pay | Admitting: Cardiology

## 2021-05-31 ENCOUNTER — Other Ambulatory Visit: Payer: Self-pay | Admitting: Obstetrics & Gynecology

## 2021-05-31 DIAGNOSIS — R921 Mammographic calcification found on diagnostic imaging of breast: Secondary | ICD-10-CM

## 2021-06-03 ENCOUNTER — Ambulatory Visit
Admission: RE | Admit: 2021-06-03 | Discharge: 2021-06-03 | Disposition: A | Discharge status: Home / Self Care | Payer: BC Managed Care – PPO | Source: Ambulatory Visit | Attending: Obstetrics & Gynecology | Admitting: Obstetrics & Gynecology

## 2021-06-03 ENCOUNTER — Other Ambulatory Visit: Payer: Self-pay | Admitting: Obstetrics & Gynecology

## 2021-06-03 DIAGNOSIS — R921 Mammographic calcification found on diagnostic imaging of breast: Secondary | ICD-10-CM

## 2021-06-13 ENCOUNTER — Other Ambulatory Visit: Payer: Self-pay | Admitting: Cardiology

## 2021-06-13 NOTE — Telephone Encounter (Signed)
Rx refill sent to pharmacy. 

## 2021-06-24 ENCOUNTER — Other Ambulatory Visit: Payer: Self-pay | Admitting: Cardiology

## 2021-06-24 DIAGNOSIS — R0609 Other forms of dyspnea: Secondary | ICD-10-CM

## 2021-06-24 DIAGNOSIS — R002 Palpitations: Secondary | ICD-10-CM

## 2021-07-08 DIAGNOSIS — R051 Acute cough: Secondary | ICD-10-CM | POA: Diagnosis not present

## 2021-07-08 DIAGNOSIS — J029 Acute pharyngitis, unspecified: Secondary | ICD-10-CM | POA: Diagnosis not present

## 2021-07-08 DIAGNOSIS — J0100 Acute maxillary sinusitis, unspecified: Secondary | ICD-10-CM | POA: Diagnosis not present

## 2021-07-08 DIAGNOSIS — Z03818 Encounter for observation for suspected exposure to other biological agents ruled out: Secondary | ICD-10-CM | POA: Diagnosis not present

## 2021-07-08 DIAGNOSIS — Z6828 Body mass index (BMI) 28.0-28.9, adult: Secondary | ICD-10-CM | POA: Diagnosis not present

## 2021-07-15 ENCOUNTER — Ambulatory Visit: Payer: BC Managed Care – PPO | Admitting: Cardiology

## 2021-09-26 ENCOUNTER — Ambulatory Visit: Payer: BC Managed Care – PPO | Admitting: Cardiology

## 2021-11-09 ENCOUNTER — Encounter: Payer: Self-pay | Admitting: Cardiology

## 2021-11-09 ENCOUNTER — Ambulatory Visit: Payer: BC Managed Care – PPO | Attending: Cardiology | Admitting: Cardiology

## 2021-11-09 VITALS — BP 112/78 | HR 59 | Ht 63.0 in | Wt 156.1 lb

## 2021-11-09 DIAGNOSIS — I700 Atherosclerosis of aorta: Secondary | ICD-10-CM

## 2021-11-09 DIAGNOSIS — E782 Mixed hyperlipidemia: Secondary | ICD-10-CM

## 2021-11-09 DIAGNOSIS — Z1321 Encounter for screening for nutritional disorder: Secondary | ICD-10-CM

## 2021-11-09 DIAGNOSIS — R002 Palpitations: Secondary | ICD-10-CM | POA: Diagnosis not present

## 2021-11-09 DIAGNOSIS — Z131 Encounter for screening for diabetes mellitus: Secondary | ICD-10-CM

## 2021-11-09 NOTE — Patient Instructions (Signed)
Medication Instructions:  Your physician recommends that you continue on your current medications as directed. Please refer to the Current Medication list given to you today.  *If you need a refill on your cardiac medications before your next appointment, please call your pharmacy*   Lab Work: Your physician recommends that you return for lab work in: the next few days. You need to have labs done when you are fasting. You do not need to make an appointment as the order has already been placed. The labs you are going to have done are BMET, CBC, TSH, LFT and Lipids.  [address] in [place]. They also close daily for lunch for 12-1.  or [address] 2nd floor M-W 8-11:30 and 1-4:30 and Thursday and Friday 8-11:30.  If you have labs (blood work) drawn today and your tests are completely normal, you will receive your results only by: [place] (if you have MyChart) OR A paper copy in the mail If you have any lab test that is abnormal or we need to change your treatment, we will call you to review the results.   Testing/Procedures: None ordered   Follow-Up: At [facility], you and your health needs are our priority.  As part of our continuing mission to provide you with exceptional heart care, we have created designated Provider Care Teams.  These Care Teams include your primary Cardiologist (physician) and Advanced Practice Providers (APPs -  Physician Assistants and Nurse Practitioners) who all work together to provide you with the care you need, when you need it.  We recommend signing up for the patient portal called "MyChart".  Sign up information is provided on this After Visit Summary.  MyChart is used to connect with patients for Virtual Visits (Telemedicine).  Patients are able to view lab/test results, encounter notes, upcoming appointments, etc.  Non-urgent messages can be sent to your provider as well.   To learn more about what you  can do with MyChart, go to [email or web address].    Your next appointment:   9 month(s)  The format for your next appointment:   In Person  Provider:   Jyl Heinz, MD   Other Instructions NA

## 2021-11-09 NOTE — Progress Notes (Signed)
Cardiology Office Note:    Date:  11/09/2021   ID:  Tiffany Ross, DOB 09-25-1960, MRN [id number]  PCP:  Luetta Nutting, DO  Cardiologist:  Jenean Lindau, MD   Referring MD: Luetta Nutting, DO    ASSESSMENT:    1. Aortic atherosclerosis ([place])   2. Mixed dyslipidemia    PLAN:    In order of problems listed above:  Primary prevention stressed with the patient.  Importance of compliance with diet medication stressed and she vocalized understanding.  She was advised to walk at least half an hour a day 5 days a week and she promises to do so. Aortic atherosclerosis: Stable at this time.  We will continue to monitor. Mixed dyslipidemia: On lipid-lowering medications.  She will come fasting in the next few days for complete blood work including lipids.  She has history of vitamin D deficiency and B12 and so we will check this. Patient will be seen in follow-up appointment in 9 months or earlier if the patient has any concerns    Medication Adjustments/Labs and Tests Ordered: Current medicines are reviewed at length with the patient today.  Concerns regarding medicines are outlined above.  No orders of the defined types were placed in this encounter.  No orders of the defined types were placed in this encounter.    No chief complaint on file.    History of Present Illness:    Tiffany Ross is a 61 y.o. female.  Patient has past medical history of aortic atherosclerosis and mixed dyslipidemia.  She denies any problems at this time and takes of activities of daily living.  No chest pain orthopnea or PND.  At the time of my evaluation, the patient is alert awake oriented and in no distress.  Past Medical History:  Diagnosis Date   Anxiety    Aortic atherosclerosis ([place]) 06/29/2020   Exertional dyspnea 05/26/2020   Lipoma of left lower extremity 04/14/2016   Mixed dyslipidemia 01/14/2021   Osteoarthritis of right hip 02/07/2017   Palpitations 06/29/2020   Sebaceous cyst 03/27/2016     Past Surgical History:  Procedure Laterality Date   ABDOMINAL HYSTERECTOMY     CESAREAN SECTION     KNEE SURGERY      Current Medications: Current Meds  Medication Sig   Calcium Carb-Cholecalciferol (CALCIUM 1000 + D PO) Take 1 tablet by mouth daily.   cetirizine (ZYRTEC) 10 MG tablet Take 10 mg by mouth as needed for allergies.   estradiol (VIVELLE-DOT) 0.1 MG/24HR patch Place 1 patch onto the skin 2 (two) times a week.   FLUoxetine (PROZAC) 20 MG capsule Take 20 mg by mouth daily.   Omega-3 Fatty Acids (FISH OIL) 1200 MG CAPS Take 1,200 mg by mouth daily.   rosuvastatin (CRESTOR) 10 MG tablet TAKE 1 TABLET(10 MG) BY MOUTH DAILY   VITAMIN D PO Take 1 capsule by mouth daily.     Allergies:   Codeine and Sulfa antibiotics   Social History   Socioeconomic History   Marital status: Married    Spouse name: Not on file   Number of children: Not on file   Years of education: Not on file   Highest education level: Not on file  Occupational History   Occupation: Homemaker  Tobacco Use   Smoking status: Never   Smokeless tobacco: Never  Vaping Use   Vaping Use: Never used  Substance and Sexual Activity   Alcohol use: Yes    Alcohol/week: 10.0 standard drinks of  alcohol    Types: 10 Glasses of wine per week    Comment: weekly   Drug use: Never   Sexual activity: Yes    Partners: Male    Birth control/protection: Surgical  Other Topics Concern   Not on file  Social History Narrative   Not on file   Social Determinants of Health   Financial Resource Strain: Not on file  Food Insecurity: Not on file  Transportation Needs: Not on file  Physical Activity: Not on file  Stress: Not on file  Social Connections: Not on file     Family History: The patient's family history includes Alzheimer's disease in her father; Diabetes in her mother; Hypertension in her brother and mother; Kidney cancer in her mother; Ovarian cancer in her sister. There is no history of Breast  cancer.  ROS:   Please see the history of present illness.    All other systems reviewed and are negative.  EKGs/Labs/Other Studies Reviewed:    The following studies were reviewed today: I discussed my findings with the patient at length.   Recent Labs: 01/14/2021: ALT 17; BUN 12; Creatinine, Ser 0.76; Potassium 4.5; Sodium 140  Recent Lipid Panel    Component Value Date/Time   CHOL 209 (H) 01/14/2021 1354   TRIG 63 01/14/2021 1354   HDL 87 01/14/2021 1354   CHOLHDL 2.4 01/14/2021 1354   LDLCALC 111 (H) 01/14/2021 1354    Physical Exam:    VS:  BP 112/78   Pulse (!) 59   Ht '5\' 3"'$  (1.6 m)   Wt 156 lb 1.3 oz (70.8 kg)   SpO2 98%   BMI 27.65 kg/m     Wt Readings from Last 3 Encounters:  11/09/21 156 lb 1.3 oz (70.8 kg)  01/14/21 153 lb 1.3 oz (69.4 kg)  07/30/20 156 lb 1.9 oz (70.8 kg)     GEN: Patient is in no acute distress HEENT: Normal NECK: No JVD; No carotid bruits LYMPHATICS: No lymphadenopathy CARDIAC: Hear sounds regular, 2/6 systolic murmur at the apex. RESPIRATORY:  Clear to auscultation without rales, wheezing or rhonchi  ABDOMEN: Soft, non-tender, non-distended MUSCULOSKELETAL:  No edema; No deformity  SKIN: Warm and dry NEUROLOGIC:  Alert and oriented x 3 PSYCHIATRIC:  Normal affect   Signed, Jenean Lindau, MD  11/09/2021 4:35 PM    [facility]

## 2021-11-10 DIAGNOSIS — Z131 Encounter for screening for diabetes mellitus: Secondary | ICD-10-CM | POA: Diagnosis not present

## 2021-11-10 DIAGNOSIS — I700 Atherosclerosis of aorta: Secondary | ICD-10-CM | POA: Diagnosis not present

## 2021-11-10 DIAGNOSIS — R002 Palpitations: Secondary | ICD-10-CM | POA: Diagnosis not present

## 2021-11-10 DIAGNOSIS — E782 Mixed hyperlipidemia: Secondary | ICD-10-CM | POA: Diagnosis not present

## 2021-11-11 LAB — CBC
Hematocrit: 41.9 % (ref 34.0–46.6)
Hemoglobin: 13.7 g/dL (ref 11.1–15.9)
MCH: 29.7 pg (ref 26.6–33.0)
MCHC: 32.7 g/dL (ref 31.5–35.7)
MCV: 91 fL (ref 79–97)
Platelets: 257 10*3/uL (ref 150–450)
RBC: 4.61 x10E6/uL (ref 3.77–5.28)
RDW: 11.8 % (ref 11.7–15.4)
WBC: 4 10*3/uL (ref 3.4–10.8)

## 2021-11-11 LAB — BASIC METABOLIC PANEL
BUN/Creatinine Ratio: 17 (ref 12–28)
BUN: 14 mg/dL (ref 8–27)
CO2: 24 mmol/L (ref 20–29)
Calcium: 9.6 mg/dL (ref 8.7–10.3)
Chloride: 101 mmol/L (ref 96–106)
Creatinine, Ser: 0.81 mg/dL (ref 0.57–1.00)
Glucose: 94 mg/dL (ref 70–99)
Potassium: 4.7 mmol/L (ref 3.5–5.2)
Sodium: 138 mmol/L (ref 134–144)
eGFR: 82 mL/min/{1.73_m2} (ref >59)

## 2021-11-11 LAB — HEPATIC FUNCTION PANEL
ALT: 22 IU/L (ref 0–32)
AST: 20 IU/L (ref 0–40)
Albumin: 4.4 g/dL (ref 3.9–4.9)
Alkaline Phosphatase: 68 IU/L (ref 44–121)
Bilirubin Total: 0.8 mg/dL (ref 0.0–1.2)
Bilirubin, Direct: 0.23 mg/dL (ref 0.00–0.40)
Total Protein: 6.5 g/dL (ref 6.0–8.5)

## 2021-11-11 LAB — LIPID PANEL
Chol/HDL Ratio: 2 ratio (ref 0.0–4.4)
Cholesterol, Total: 178 mg/dL (ref 100–199)
HDL: 88 mg/dL (ref >39)
LDL Chol Calc (NIH): 77 mg/dL (ref 0–99)
Triglycerides: 70 mg/dL (ref 0–149)
VLDL Cholesterol Cal: 13 mg/dL (ref 5–40)

## 2021-11-11 LAB — HEMOGLOBIN A1C
Est. average glucose Bld gHb Est-mCnc: 97 mg/dL
Hgb A1c MFr Bld: 5 % (ref 4.8–5.6)

## 2021-11-11 LAB — TSH: TSH: 0.833 u[IU]/mL (ref 0.450–4.500)

## 2021-11-11 LAB — VITAMIN D 25 HYDROXY (VIT D DEFICIENCY, FRACTURES): Vit D, 25-Hydroxy: 43.7 ng/mL (ref 30.0–100.0)

## 2021-11-17 DIAGNOSIS — Z23 Encounter for immunization: Secondary | ICD-10-CM | POA: Diagnosis not present

## 2021-12-05 ENCOUNTER — Ambulatory Visit
Admission: RE | Admit: 2021-12-05 | Discharge: 2021-12-05 | Disposition: A | Discharge status: Home / Self Care | Payer: BC Managed Care – PPO | Source: Ambulatory Visit | Attending: Obstetrics & Gynecology | Admitting: Obstetrics & Gynecology

## 2021-12-05 DIAGNOSIS — R921 Mammographic calcification found on diagnostic imaging of breast: Secondary | ICD-10-CM

## 2021-12-30 ENCOUNTER — Other Ambulatory Visit: Payer: Self-pay | Admitting: Cardiology

## 2021-12-30 NOTE — Telephone Encounter (Signed)
Rx refill sent to pharmacy. 

## 2022-02-01 DIAGNOSIS — Z6829 Body mass index (BMI) 29.0-29.9, adult: Secondary | ICD-10-CM | POA: Diagnosis not present

## 2022-02-01 DIAGNOSIS — Z124 Encounter for screening for malignant neoplasm of cervix: Secondary | ICD-10-CM | POA: Diagnosis not present

## 2022-02-01 DIAGNOSIS — Z1151 Encounter for screening for human papillomavirus (HPV): Secondary | ICD-10-CM | POA: Diagnosis not present

## 2022-02-01 DIAGNOSIS — Z01419 Encounter for gynecological examination (general) (routine) without abnormal findings: Secondary | ICD-10-CM | POA: Diagnosis not present

## 2022-03-21 ENCOUNTER — Ambulatory Visit (INDEPENDENT_AMBULATORY_CARE_PROVIDER_SITE_OTHER): Payer: BC Managed Care – PPO | Admitting: Family Medicine

## 2022-03-21 VITALS — Temp 97.7°F

## 2022-03-21 DIAGNOSIS — Z23 Encounter for immunization: Secondary | ICD-10-CM

## 2022-03-21 NOTE — Progress Notes (Signed)
Medical screening examination/treatment was performed by qualified clinical staff member and as supervising physician I was immediately available for consultation/collaboration. I have reviewed documentation and agree with assessment and plan.  Ava Tangney, DO  

## 2022-03-21 NOTE — Progress Notes (Signed)
   Established Patient Office Visit  Subjective   Patient ID: Tiffany Ross, female    DOB: 05-22-1960  Age: 62 y.o. MRN: [id number]  Chief Complaint  Patient presents with   Tdap Vaccine    Patient in office for nurse visit for Tdap vaccine.    HPI  Tdap vaccine - nurse visit. Patient denies problems with any previous Tdap vaccinations. Patient is feeling well today. Patient receiving the vaccine because daughter is expecting  her first grandchild. Vaccine administration approved by Dr. Zigmund Daniel.   ROS    Objective:     Temp 97.7 F (36.5 C)    Physical Exam   No results found for any visits on 03/21/22.    The 10-year ASCVD risk score (Arnett DK, et al., 2019) is: 3%    Assessment & Plan:  Tdap vaccine -  admin 0.90ml IM  Left deltoid. Patient tolerated injection well without complications.  Problem List Items Addressed This Visit   None Visit Diagnoses     Need for diphtheria-tetanus-pertussis (Tdap) vaccine    -  Primary   Relevant Orders   Tdap vaccine greater than or equal to 7yo IM (Completed)       Return for return as needed.Rae Lips, LPN

## 2022-05-18 IMAGING — CT CT CARDIAC CORONARY ARTERY CALCIUM SCORE
3 series · 14 of 20 positions shown, 15 images · non-contrast
Comparison: None.
COMPARISON: None.

Addendum:
EXAM:
OVER-READ INTERPRETATION  CT CHEST

The following report is an over-read performed by radiologist Dr.
Rinber Puente [REDACTED] on 07/27/2020. This over-read
does not include interpretation of cardiac or coronary anatomy or
pathology. The calcium score interpretation by the cardiologist is
attached.
CLINICAL DATA: Cardiovascular Disease Risk stratification
Coronary Calcium Score
TECHNIQUE: A gated, non-contrast computed tomography scan of the heart was
performed using 3mm slice thickness. Axial images were analyzed on a
dedicated workstation. Calcium scoring of the coronary arteries was
performed using the Agatston method.

[Series 2: casc 3.0 bv41 2 bestdiast 70 % · axial · 0.43mm/px · z∈[-235,-145]mm · 4 of 52 slices shown, 5 images]
[im 11/52  vessel]
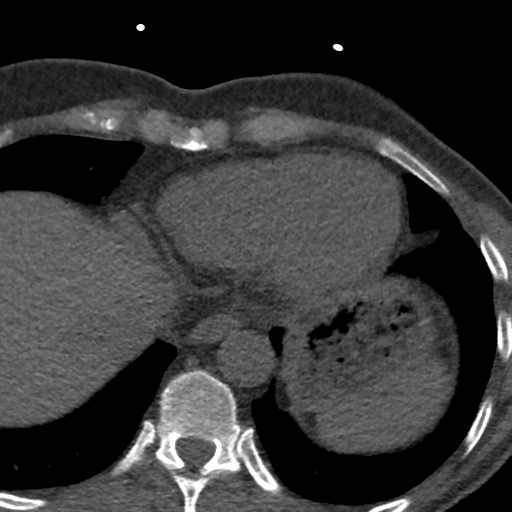
[im 11/52  lung]
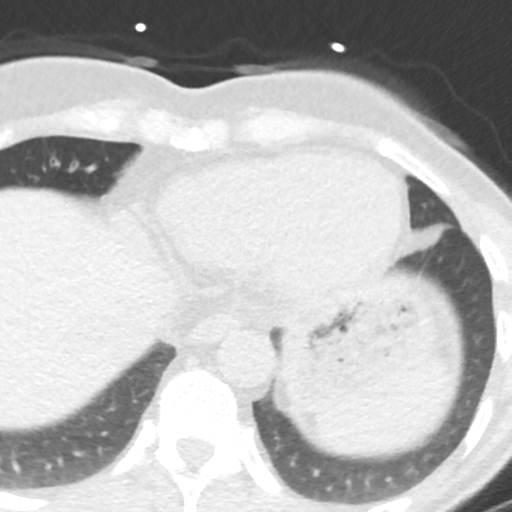
[im 21/52  vessel]
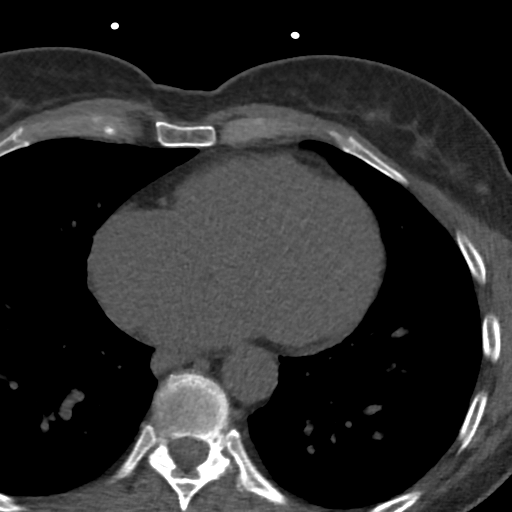
[im 31/52  vessel]
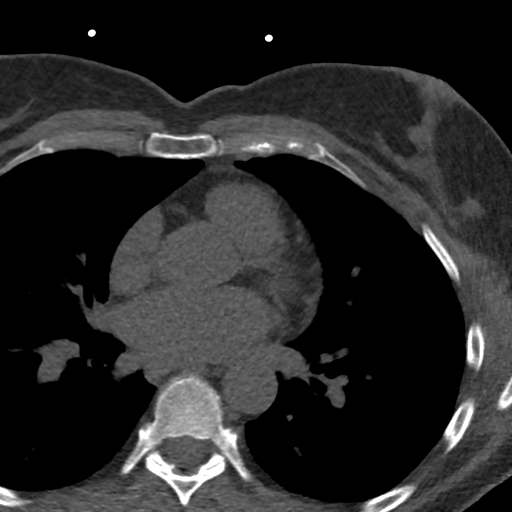
[im 41/52  vessel]
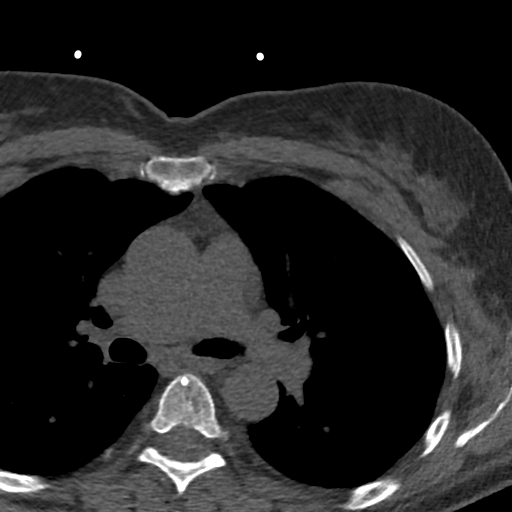

[Series 3: lung 70 % · axial · 0.68mm/px · z∈[-241,-139]mm · 5 of 52 slices shown]
[im 9/52  lung]
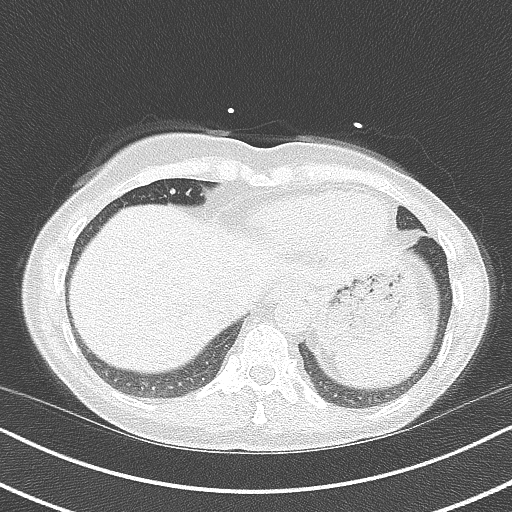
[im 18/52  lung]
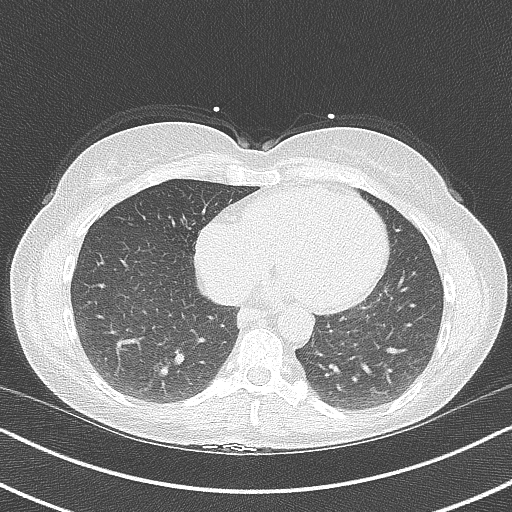
[im 26/52  lung]
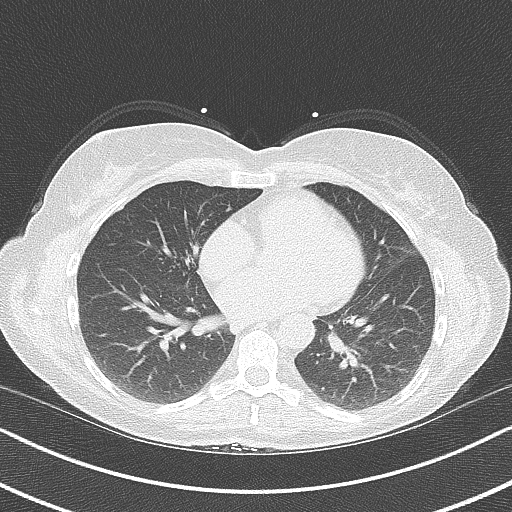
[im 35/52  lung]
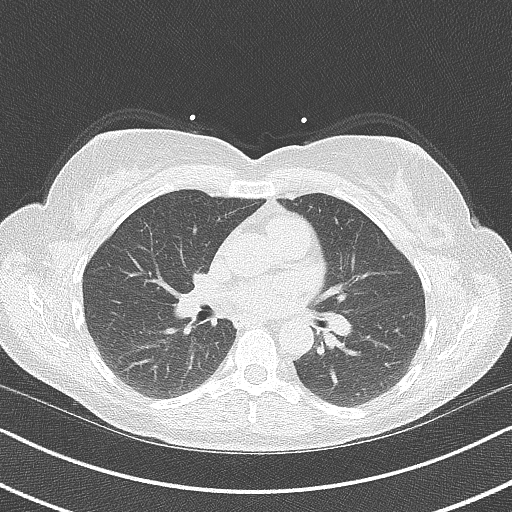
[im 43/52  lung]
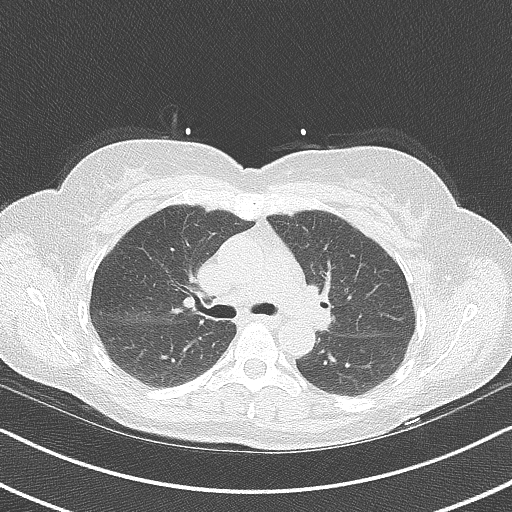

[Series 4: lung st 70 % · axial · 0.68mm/px · z∈[-241,-139]mm · 5 of 52 slices shown]
[im 9/52  lung]
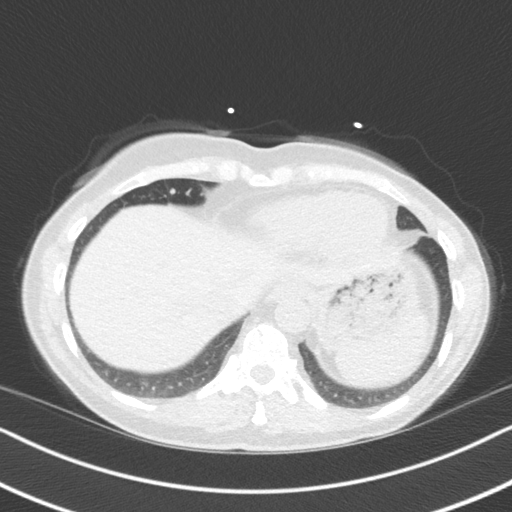
[im 18/52  lung]
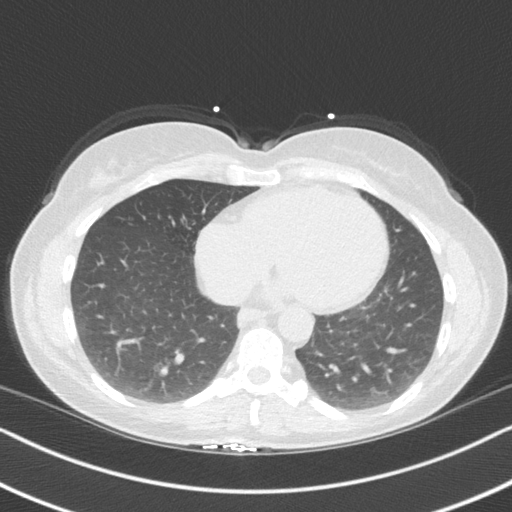
[im 26/52  lung]
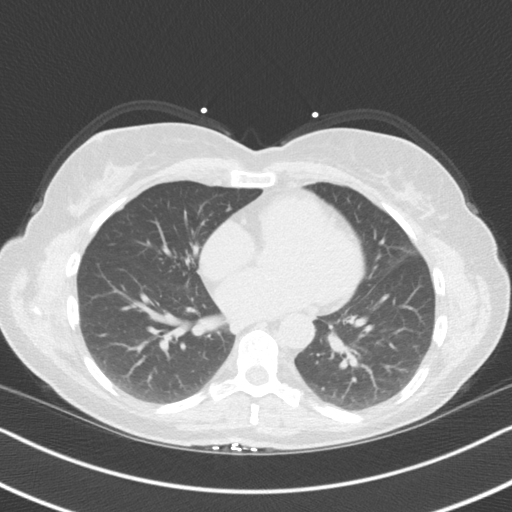
[im 35/52  lung]
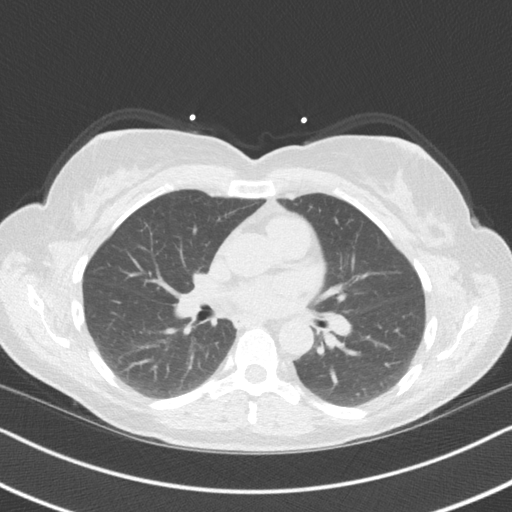
[im 43/52  lung]
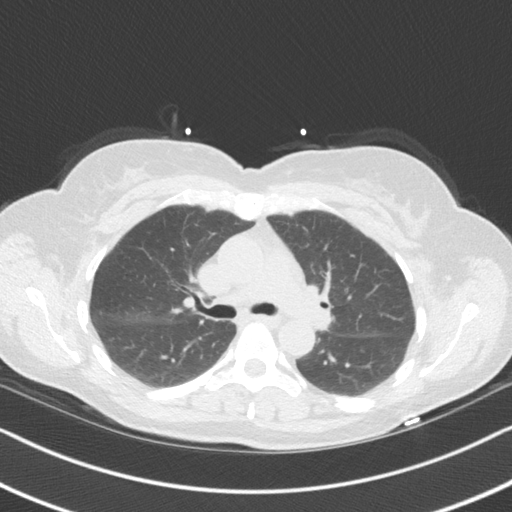

[14 of 20 positions shown; findings below may reference images not displayed]

FINDINGS: Vascular: Normal aortic caliber.

Mediastinum/Nodes: No imaged thoracic adenopathy.

Lungs/Pleura: No pleural fluid. Mild mucoid impaction involving the
right middle lobe inferiorly.

Upper Abdomen: Normal imaged portions of the liver, spleen, stomach.

Musculoskeletal: No acute osseous abnormality.
IMPRESSION: No acute findings in the imaged extracardiac chest.
FINDINGS: Coronary arteries: Normal origins.

Coronary Calcium Score:

Left main: 0

Left anterior descending artery: 0

Left circumflex artery: 0

Right coronary artery: 0

Total: 0

Percentile:

Pericardium: Normal.

Ascending Aorta: Normal caliber.

Non-cardiac: See separate report from [REDACTED].
IMPRESSION: Coronary calcium score of 0.



If CAC=0, it is reasonable to withhold statin therapy and reassess
in 5 to 10 years, as long as higher risk conditions are absent
(diabetes mellitus, family history of premature CHD in first degree
relatives (males <55 years; females <65 years), cigarette smoking,
or LDL >=190 mg/dL).

If CAC is 1 to 99, it is reasonable to initiate statin therapy for
patients >=55 years of age.

If CAC is >=100 or >=75th percentile, it is reasonable to initiate
statin therapy at any age.

Cardiology referral should be considered for patients with CAC
scores >=400 or >=75th percentile.

*3146 AHA/ACC/AACVPR/AAPA/ABC/MD HASSAN/NREC/GURIERREZ/Anyelito/ROMAIN/MOKYKLA/RENTACAR
Guideline on the Management of Blood Cholesterol: A Report of the
American College of Cardiology/American Heart Association Task Force
on Clinical Practice Guidelines. J Am Coll Cardiol.
2495;73(24):4041-4414.

*** End of Addendum ***
EXAM:
OVER-READ INTERPRETATION  CT CHEST

The following report is an over-read performed by radiologist Dr.
Rinber Puente [REDACTED] on 07/27/2020. This over-read
does not include interpretation of cardiac or coronary anatomy or
pathology. The calcium score interpretation by the cardiologist is
attached.
FINDINGS: Vascular: Normal aortic caliber.

Mediastinum/Nodes: No imaged thoracic adenopathy.

Lungs/Pleura: No pleural fluid. Mild mucoid impaction involving the
right middle lobe inferiorly.

Upper Abdomen: Normal imaged portions of the liver, spleen, stomach.

Musculoskeletal: No acute osseous abnormality.
IMPRESSION: No acute findings in the imaged extracardiac chest.

## 2022-08-30 ENCOUNTER — Ambulatory Visit: Payer: BC Managed Care – PPO | Attending: Cardiology | Admitting: Cardiology

## 2022-08-30 ENCOUNTER — Encounter: Payer: Self-pay | Admitting: Cardiology

## 2022-08-30 VITALS — BP 114/66 | HR 56 | Ht 62.6 in | Wt 158.0 lb

## 2022-08-30 DIAGNOSIS — E782 Mixed hyperlipidemia: Secondary | ICD-10-CM

## 2022-08-30 DIAGNOSIS — Z1321 Encounter for screening for nutritional disorder: Secondary | ICD-10-CM

## 2022-08-30 DIAGNOSIS — Z131 Encounter for screening for diabetes mellitus: Secondary | ICD-10-CM | POA: Diagnosis not present

## 2022-08-30 DIAGNOSIS — R002 Palpitations: Secondary | ICD-10-CM | POA: Diagnosis not present

## 2022-08-30 DIAGNOSIS — I700 Atherosclerosis of aorta: Secondary | ICD-10-CM

## 2022-08-30 NOTE — Progress Notes (Signed)
Cardiology Office Note:    Date:  08/30/2022   ID:  Tiffany Ross, DOB [birth year], MRN [id number]  PCP:  Everrett Coombe, DO  Cardiologist:  Garwin Brothers, MD   Referring MD: Everrett Coombe, DO    ASSESSMENT:    1. Mixed dyslipidemia   2. Palpitations   3. Aortic atherosclerosis (HCC)   4. Encounter for vitamin deficiency screening   5. Diabetes mellitus screening    PLAN:    In order of problems listed above:  Primary prevention stressed with the patient.  Importance of compliance with diet medication stressed and patient verbalized standing. She was advised to exercise at least half an hour a day 5 days a week and she promises to do so. Mixed dyslipidemia: Lipid-lowering medications.  Lipids reviewed and she is fasting and will have complete blood work today. Aortic atherosclerosis: Stable and discussed with her and we will continue to monitor. Palpitations: These have completely resolved and she is happy about it. Weight reduction stressed.  She request vitamin D screening in view of history of deficiency and we will do this for her.  Hemoglobin A1c will also be checked.Patient will be seen in follow-up appointment in 12 months or earlier if the patient has any concerns.    Medication Adjustments/Labs and Tests Ordered: Current medicines are reviewed at length with the patient today.  Concerns regarding medicines are outlined above.  Orders Placed This Encounter  Procedures   Comprehensive metabolic panel   CBC   Hemoglobin A1c   Lipid panel   TSH   VITAMIN D 25 Hydroxy (Vit-D Deficiency, Fractures)   No orders of the defined types were placed in this encounter.    No chief complaint on file.    History of Present Illness:    Tiffany Ross is a 62 y.o. female.  Patient has past medical history of aortic atherosclerosis, palpitations and dyslipidemia.  She denies any problems at this time and takes care of her activities of daily living.  No chest pain orthopnea  or PND.  She exercises on a regular basis.  At the time of my evaluation, the patient is alert awake oriented and in no distress.  Past Medical History:  Diagnosis Date   Anxiety    Aortic atherosclerosis (HCC) 06/29/2020   Exertional dyspnea 05/26/2020   Lipoma of left lower extremity 04/14/2016   Mixed dyslipidemia 01/14/2021   Osteoarthritis of right hip 02/07/2017   Palpitations 06/29/2020   Sebaceous cyst 03/27/2016    Past Surgical History:  Procedure Laterality Date   ABDOMINAL HYSTERECTOMY     CESAREAN SECTION     KNEE SURGERY      Current Medications: Current Meds  Medication Sig   Calcium Carb-Cholecalciferol (CALCIUM 1000 + D PO) Take 1 tablet by mouth daily.   CALCIUM PO Take 1 tablet by mouth daily.   cetirizine (ZYRTEC) 10 MG tablet Take 10 mg by mouth as needed for allergies.   estradiol (VIVELLE-DOT) 0.1 MG/24HR patch Place 1 patch onto the skin 2 (two) times a week.   FLUoxetine (PROZAC) 20 MG capsule Take 20 mg by mouth daily.   Omega-3 Fatty Acids (FISH OIL) 1200 MG CAPS Take 1,200 mg by mouth daily.   rosuvastatin (CRESTOR) 10 MG tablet Take 1 tablet (10 mg total) by mouth daily.   VITAMIN D PO Take 1 capsule by mouth daily.     Allergies:   Codeine and Sulfa antibiotics   Social History   Socioeconomic History  Marital status: Married    Spouse name: Not on file   Number of children: Not on file   Years of education: Not on file   Highest education level: Not on file  Occupational History   Occupation: Homemaker  Tobacco Use   Smoking status: Never   Smokeless tobacco: Never  Vaping Use   Vaping status: Never Used  Substance and Sexual Activity   Alcohol use: Yes    Alcohol/week: 10.0 standard drinks of alcohol    Types: 10 Glasses of wine per week    Comment: weekly   Drug use: Never   Sexual activity: Yes    Partners: Male    Birth control/protection: Surgical  Other Topics Concern   Not on file  Social History Narrative   Not on file    Social Determinants of Health   Financial Resource Strain: Not on file  Food Insecurity: Not on file  Transportation Needs: Not on file  Physical Activity: Not on file  Stress: Not on file  Social Connections: Not on file     Family History: The patient's family history includes Alzheimer's disease in her father; Diabetes in her mother; Hypertension in her brother and mother; Kidney cancer in her mother; Ovarian cancer in her sister. There is no history of Breast cancer.  ROS:   Please see the history of present illness.    All other systems reviewed and are negative.  EKGs/Labs/Other Studies Reviewed:    The following studies were reviewed today: I discussed my findings with the patient at length.   Recent Labs: 11/10/2021: ALT 22; BUN 14; Creatinine, Ser 0.81; Hemoglobin 13.7; Platelets 257; Potassium 4.7; Sodium 138; TSH 0.833  Recent Lipid Panel    Component Value Date/Time   CHOL 178 11/10/2021 0910   TRIG 70 11/10/2021 0910   HDL 88 11/10/2021 0910   CHOLHDL 2.0 11/10/2021 0910   LDLCALC 77 11/10/2021 0910    Physical Exam:    VS:  BP 114/66   Pulse (!) 56   Ht 5' 2.6" (1.59 m)   Wt 158 lb 0.6 oz (71.7 kg)   SpO2 99%   BMI 28.35 kg/m     Wt Readings from Last 3 Encounters:  08/30/22 158 lb 0.6 oz (71.7 kg)  11/09/21 156 lb 1.3 oz (70.8 kg)  01/14/21 153 lb 1.3 oz (69.4 kg)     GEN: Patient is in no acute distress HEENT: Normal NECK: No JVD; No carotid bruits LYMPHATICS: No lymphadenopathy CARDIAC: Hear sounds regular, 2/6 systolic murmur at the apex. RESPIRATORY:  Clear to auscultation without rales, wheezing or rhonchi  ABDOMEN: Soft, non-tender, non-distended MUSCULOSKELETAL:  No edema; No deformity  SKIN: Warm and dry NEUROLOGIC:  Alert and oriented x 3 PSYCHIATRIC:  Normal affect   Signed, Garwin Brothers, MD  08/30/2022 9:05 AM    [facility]

## 2022-08-30 NOTE — Patient Instructions (Signed)
Medication Instructions:  Your physician recommends that you continue on your current medications as directed. Please refer to the Current Medication list given to you today.  *If you need a refill on your cardiac medications before your next appointment, please call your pharmacy*   Lab Work: Your physician recommends that you have a CMP, CBC, TSH, A1C, vitamin D and lipids today in the office.  If you have labs (blood work) drawn today and your tests are completely normal, you will receive your results only by: MyChart Message (if you have MyChart) OR A paper copy in the mail If you have any lab test that is abnormal or we need to change your treatment, we will call you to review the results.   Testing/Procedures: None ordered   Follow-Up: At [facility], you and your health needs are our priority.  As part of our continuing mission to provide you with exceptional heart care, we have created designated Provider Care Teams.  These Care Teams include your primary Cardiologist (physician) and Advanced Practice Providers (APPs -  Physician Assistants and Nurse Practitioners) who all work together to provide you with the care you need, when you need it.  We recommend signing up for the patient portal called "MyChart".  Sign up information is provided on this After Visit Summary.  MyChart is used to connect with patients for Virtual Visits (Telemedicine).  Patients are able to view lab/test results, encounter notes, upcoming appointments, etc.  Non-urgent messages can be sent to your provider as well.   To learn more about what you can do with MyChart, go to [email or web address].    Your next appointment:   12 month(s)  The format for your next appointment:   In Person  Provider:   Belva Crome, MD    Other Instructions none  Important Information About Sugar

## 2022-08-31 ENCOUNTER — Encounter: Payer: Self-pay | Admitting: Student in an Organized Health Care Education/Training Program

## 2022-08-31 ENCOUNTER — Telehealth: Payer: Self-pay | Admitting: Cardiology

## 2022-08-31 ENCOUNTER — Encounter: Payer: Self-pay | Admitting: Cardiology

## 2022-08-31 ENCOUNTER — Telehealth: Payer: Self-pay

## 2022-08-31 DIAGNOSIS — E875 Hyperkalemia: Secondary | ICD-10-CM

## 2022-08-31 LAB — COMPREHENSIVE METABOLIC PANEL
ALT: 20 IU/L (ref 0–32)
AST: 19 IU/L (ref 0–40)
Albumin: 4.3 g/dL (ref 3.9–4.9)
Alkaline Phosphatase: 70 IU/L (ref 44–121)
BUN/Creatinine Ratio: 18 (ref 12–28)
BUN: 14 mg/dL (ref 8–27)
Bilirubin Total: 0.7 mg/dL (ref 0.0–1.2)
CO2: 24 mmol/L (ref 20–29)
Calcium: 9.9 mg/dL (ref 8.7–10.3)
Chloride: 107 mmol/L — ABNORMAL HIGH (ref 96–106)
Creatinine, Ser: 0.77 mg/dL (ref 0.57–1.00)
Globulin, Total: 2.2 g/dL (ref 1.5–4.5)
Glucose: 98 mg/dL (ref 70–99)
Potassium: 5.8 mmol/L (ref 3.5–5.2)
Sodium: 145 mmol/L — ABNORMAL HIGH (ref 134–144)
Total Protein: 6.5 g/dL (ref 6.0–8.5)
eGFR: 87 mL/min/{1.73_m2} (ref >59)

## 2022-08-31 LAB — LIPID PANEL
Chol/HDL Ratio: 2 ratio (ref 0.0–4.4)
Cholesterol, Total: 169 mg/dL (ref 100–199)
HDL: 83 mg/dL (ref >39)
LDL Chol Calc (NIH): 70 mg/dL (ref 0–99)
Triglycerides: 85 mg/dL (ref 0–149)
VLDL Cholesterol Cal: 16 mg/dL (ref 5–40)

## 2022-08-31 LAB — VITAMIN D 25 HYDROXY (VIT D DEFICIENCY, FRACTURES): Vit D, 25-Hydroxy: 45.8 ng/mL (ref 30.0–100.0)

## 2022-08-31 LAB — CBC
Hematocrit: 43.2 % (ref 34.0–46.6)
Hemoglobin: 13.7 g/dL (ref 11.1–15.9)
MCH: 29.4 pg (ref 26.6–33.0)
MCHC: 31.7 g/dL (ref 31.5–35.7)
MCV: 93 fL (ref 79–97)
Platelets: 278 10*3/uL (ref 150–450)
RBC: 4.66 x10E6/uL (ref 3.77–5.28)
RDW: 12 % (ref 11.7–15.4)
WBC: 4.9 10*3/uL (ref 3.4–10.8)

## 2022-08-31 LAB — TSH: TSH: 1.05 u[IU]/mL (ref 0.450–4.500)

## 2022-08-31 LAB — HEMOGLOBIN A1C
Est. average glucose Bld gHb Est-mCnc: 94 mg/dL
Hgb A1c MFr Bld: 4.9 % (ref 4.8–5.6)

## 2022-08-31 MED ORDER — SODIUM POLYSTYRENE SULFONATE PO POWD
Freq: Once | ORAL | 0 refills | Status: DC
Start: 2022-08-31 — End: 2022-08-31

## 2022-08-31 MED ORDER — SODIUM POLYSTYRENE SULFONATE PO POWD: Freq: Once | ORAL | 0 refills | Status: DC

## 2022-08-31 NOTE — Progress Notes (Signed)
I was paged by [facility] regarding a critical potassium of 5.8. The patient has no co-morbid conditions or medications that would predispose her to developing hyperkalemia. I requested that [facility] re-run the sample given that it may be spurious. [facility] will notify cardiology once the new results have resulted.

## 2022-08-31 NOTE — Telephone Encounter (Signed)
*  STAT* If patient is at the pharmacy, call can be transferred to refill team.   1. Which medications need to be refilled? (please list name of each medication and dose if known) sodium polystyrene (KAYEXALATE) powder   2. Which pharmacy/location (including street and city if local pharmacy) is medication to be sent to?  [id number] - [place], [place] - [address]      3. Do they need a 30 day or 90 day supply? 90 day   Pt is completely out of this medication

## 2022-08-31 NOTE — Telephone Encounter (Signed)
RX has been sent.

## 2022-08-31 NOTE — Telephone Encounter (Signed)
-----   Message from [place] R Revankar sent at 08/31/2022  9:50 AM EDT ----- Kayexalate 30 g 1 dose only.  Recheck potassium on Monday.  Keep food low in potassium.  And I would like to get another Chem-7 in a month.  Copy primary care Garwin Brothers, MD 08/31/2022 9:50 AM

## 2022-09-01 ENCOUNTER — Other Ambulatory Visit: Payer: Self-pay | Admitting: Cardiology

## 2022-09-01 DIAGNOSIS — E875 Hyperkalemia: Secondary | ICD-10-CM

## 2022-09-01 MED ORDER — SODIUM POLYSTYRENE SULFONATE PO POWD
Freq: Once | ORAL | 0 refills | Status: DC
Start: 2022-09-01 — End: 2022-09-01

## 2022-09-05 MED ORDER — SODIUM POLYSTYRENE SULFONATE PO POWD: Freq: Once | ORAL | 0 refills | Status: AC

## 2022-09-05 NOTE — Addendum Note (Signed)
Addended by: Eleonore Chiquito on: 09/05/2022 07:27 AM   Modules accepted: Orders

## 2022-09-07 DIAGNOSIS — E875 Hyperkalemia: Secondary | ICD-10-CM | POA: Diagnosis not present

## 2022-09-08 LAB — BASIC METABOLIC PANEL
BUN/Creatinine Ratio: 19 (ref 12–28)
BUN: 13 mg/dL (ref 8–27)
CO2: 25 mmol/L (ref 20–29)
Calcium: 9.7 mg/dL (ref 8.7–10.3)
Chloride: 103 mmol/L (ref 96–106)
Creatinine, Ser: 0.69 mg/dL (ref 0.57–1.00)
Glucose: 83 mg/dL (ref 70–99)
Potassium: 4.6 mmol/L (ref 3.5–5.2)
Sodium: 140 mmol/L (ref 134–144)
eGFR: 98 mL/min/{1.73_m2} (ref >59)

## 2022-12-24 ENCOUNTER — Other Ambulatory Visit: Payer: Self-pay | Admitting: Cardiology

## 2022-12-25 NOTE — Telephone Encounter (Signed)
Prescription sent to pharmacy.

## 2023-01-08 ENCOUNTER — Other Ambulatory Visit: Payer: Self-pay | Admitting: Obstetrics & Gynecology

## 2023-01-08 DIAGNOSIS — R921 Mammographic calcification found on diagnostic imaging of breast: Secondary | ICD-10-CM

## 2023-02-05 ENCOUNTER — Ambulatory Visit
Admission: RE | Admit: 2023-02-05 | Discharge: 2023-02-05 | Disposition: A | Discharge status: Home / Self Care | Payer: BC Managed Care – PPO | Source: Ambulatory Visit | Attending: Obstetrics & Gynecology | Admitting: Obstetrics & Gynecology

## 2023-02-05 DIAGNOSIS — R921 Mammographic calcification found on diagnostic imaging of breast: Secondary | ICD-10-CM | POA: Diagnosis not present

## 2023-02-06 DIAGNOSIS — Z6828 Body mass index (BMI) 28.0-28.9, adult: Secondary | ICD-10-CM | POA: Diagnosis not present

## 2023-02-06 DIAGNOSIS — Z01419 Encounter for gynecological examination (general) (routine) without abnormal findings: Secondary | ICD-10-CM | POA: Diagnosis not present

## 2023-05-03 DIAGNOSIS — M25522 Pain in left elbow: Secondary | ICD-10-CM | POA: Insufficient documentation

## 2023-05-03 HISTORY — DX: Pain in left elbow: M25.522

## 2023-05-16 ENCOUNTER — Encounter: Payer: Self-pay | Admitting: Family Medicine

## 2023-05-16 ENCOUNTER — Ambulatory Visit: Admitting: Family Medicine

## 2023-05-16 VITALS — BP 112/73 | HR 60 | Ht 62.6 in | Wt 160.0 lb

## 2023-05-16 DIAGNOSIS — D179 Benign lipomatous neoplasm, unspecified: Secondary | ICD-10-CM | POA: Diagnosis not present

## 2023-05-16 DIAGNOSIS — R221 Localized swelling, mass and lump, neck: Secondary | ICD-10-CM | POA: Diagnosis not present

## 2023-05-16 HISTORY — DX: Localized swelling, mass and lump, neck: R22.1

## 2023-05-16 HISTORY — DX: Benign lipomatous neoplasm, unspecified: D17.9

## 2023-05-16 NOTE — Progress Notes (Signed)
 Tiffany Ross - 63 y.o. female MRN [id number]  Date of birth: Jun 24, 1960  Subjective Chief Complaint  Patient presents with   Cyst   Mass    HPI Tiffany Ross is a 63 year old female here today with concern of multiple soft tissue masses.  She has had these for quite some time.  She does have.  Her left axilla started to cause some pain.  She denies any swelling or drainage from these areas.  She also has a place on her scalp that is bothersome.    She also feels some fullness around her neck and small bumps around her lips.    ROS:  A comprehensive ROS was completed and negative except as noted per HPI  Allergies  Allergen Reactions   Codeine Other (See Comments)    Violent nightmares   Sulfa Antibiotics Rash    Past Medical History:  Diagnosis Date   Anxiety    Aortic atherosclerosis (HCC) 06/29/2020   Exertional dyspnea 05/26/2020   Lipoma of left lower extremity 04/14/2016   Mixed dyslipidemia 01/14/2021   Osteoarthritis of right hip 02/07/2017   Palpitations 06/29/2020   Sebaceous cyst 03/27/2016    Past Surgical History:  Procedure Laterality Date   ABDOMINAL HYSTERECTOMY     CESAREAN SECTION     KNEE SURGERY      Social History   Socioeconomic History   Marital status: Married    Spouse name: Not on file   Number of children: Not on file   Years of education: Not on file   Highest education level: Not on file  Occupational History   Occupation: Homemaker  Tobacco Use   Smoking status: Never   Smokeless tobacco: Never  Vaping Use   Vaping status: Never Used  Substance and Sexual Activity   Alcohol use: Yes    Alcohol/week: 10.0 standard drinks of alcohol    Types: 10 Glasses of wine per week    Comment: weekly   Drug use: Never   Sexual activity: Yes    Partners: Male    Birth control/protection: Surgical  Other Topics Concern   Not on file  Social History Narrative   Not on file   Social Drivers of Health   Financial Resource Strain: Not on file   Food Insecurity: Not on file  Transportation Needs: Not on file  Physical Activity: Not on file  Stress: Not on file  Social Connections: Not on file    Family History  Problem Relation Age of Onset   Hypertension Mother    Diabetes Mother    Kidney cancer Mother    Alzheimer's disease Father    Ovarian cancer Sister    Hypertension Brother    Breast cancer Neg Hx     Health Maintenance  Topic Date Due   HIV Screening  Never done   Hepatitis C Screening  Never done   Colonoscopy  Never done   Zoster Vaccines- Shingrix (1 of 2) Never done   COVID-19 Vaccine (4 - 2024-25 season) 09/03/2022   Cervical Cancer Screening (HPV/Pap Cotest)  10/28/2022   INFLUENZA VACCINE  08/03/2023   MAMMOGRAM  02/04/2025   DTaP/Tdap/Td (3 - Td or Tdap) 03/20/2032   HPV VACCINES  Aged Out   Meningococcal B Vaccine  Aged Out     ----------------------------------------------------------------------------------------------------------------------------------------------------------------------------------------------------------------- Physical Exam BP 112/73 (BP Location: Left Arm, Patient Position: Sitting, Cuff Size: Normal)   Pulse 60   Ht 5' 2.6" (1.59 m)   Wt 160 lb (72.6  kg)   SpO2 99%   BMI 28.71 kg/m   Physical Exam Constitutional:      Appearance: Normal appearance.  Eyes:     General: No scleral icterus. Cardiovascular:     Rate and Rhythm: Normal rate and regular rhythm.  Pulmonary:     Effort: Pulmonary effort is normal.     Breath sounds: Normal breath sounds.  Skin:    Comments: Multiple soft tissue nodules along the bilateral forearms, scalp and left axilla.  Area in the left axilla is not inflamed but is tender with pressure.  Neurological:     Mental Status: She is alert.  Psychiatric:        Mood and Affect: Mood normal.        Behavior: Behavior normal.      ------------------------------------------------------------------------------------------------------------------------------------------------------------------------------------------------------------------- Assessment and Plan  Multiple lipomas Multiple soft tissue nodules, likely lipomas.  She would like to have these excised, especially the one in the left axilla that is causing some pain.  Referral placed to general surgery.  Neck fullness Checking thyroid  function test.   No orders of the defined types were placed in this encounter.   No follow-ups on file.

## 2023-05-16 NOTE — Assessment & Plan Note (Signed)
 Multiple soft tissue nodules, likely lipomas.  She would like to have these excised, especially the one in the left axilla that is causing some pain.  Referral placed to general surgery.

## 2023-05-16 NOTE — Assessment & Plan Note (Signed)
 Checking thyroid  function test.

## 2023-05-17 LAB — TSH+FREE T4
Free T4: 0.88 ng/dL (ref 0.82–1.77)
TSH: 0.999 u[IU]/mL (ref 0.450–4.500)

## 2023-05-18 ENCOUNTER — Ambulatory Visit: Payer: Self-pay | Admitting: Family Medicine

## 2023-05-18 ENCOUNTER — Telehealth: Payer: Self-pay

## 2023-05-18 NOTE — Telephone Encounter (Signed)
 Copied from CRM [id number]. Topic: Referral - Status >> May 18, 2023  2:01 PM Retta Caster wrote: Reason for CRM: Ambulatory referral to General Surgery (Order # [id number]) on 05/16/2023  Gilles Lacks from pediatrics stated referral was faxed over to wrong location Just fyi for offce

## 2023-05-18 NOTE — Telephone Encounter (Signed)
 Clifford thanks

## 2023-05-24 ENCOUNTER — Ambulatory Visit: Payer: Self-pay

## 2023-05-24 NOTE — Telephone Encounter (Signed)
  Chief Complaint: Results  Additional Notes: Patient saw that her TSH level was normal and wanted to know what the next plan of care is for her. Advised that general surgery was consulted for the Multiple Lipomas. Patient is asking what is the plan for her. Call made to [facility]. This RN was advised to inform patient that a nurse from the clinic will f/u with her.   Reason for Disposition  Caller requesting lab results  (Exception: Routine or non-urgent lab result.)  Answer Assessment - Initial Assessment Questions 1. REASON FOR CALL or QUESTION: "What is your reason for calling today?" or "How can I best help you?" or "What question do you have that I can help answer?"     Patient asking what is the plan since her TSH level was normal  2. CALLER: Document the source of call. (e.g., laboratory, patient).     Patient  Protocols used: PCP Call - No Triage-A-AH

## 2023-05-24 NOTE — Telephone Encounter (Signed)
 This RN made the first attempt to contact the pt for triage. Pt did not answer. RN LVM with a callback number.   Copied from CRM [id number]. Topic: Clinical - Medical Advice >> May 24, 2023  3:23 PM Kevelyn M wrote: Reason for CRM: Patient wants to know since her thyroid  readings were normal, what's the next course of action. Patient experienced swelling at the base her neck.

## 2023-05-29 DIAGNOSIS — M25522 Pain in left elbow: Secondary | ICD-10-CM | POA: Diagnosis not present

## 2023-05-30 DIAGNOSIS — Z1330 Encounter for screening examination for mental health and behavioral disorders, unspecified: Secondary | ICD-10-CM | POA: Diagnosis not present

## 2023-05-30 DIAGNOSIS — R220 Localized swelling, mass and lump, head: Secondary | ICD-10-CM | POA: Diagnosis not present

## 2023-05-30 DIAGNOSIS — R2233 Localized swelling, mass and lump, upper limb, bilateral: Secondary | ICD-10-CM | POA: Diagnosis not present

## 2023-06-05 DIAGNOSIS — M25522 Pain in left elbow: Secondary | ICD-10-CM | POA: Diagnosis not present

## 2023-06-27 ENCOUNTER — Other Ambulatory Visit: Payer: Self-pay | Admitting: Cardiology

## 2023-08-29 ENCOUNTER — Encounter: Payer: Self-pay | Admitting: Cardiology

## 2023-08-29 ENCOUNTER — Ambulatory Visit: Attending: Cardiology | Admitting: Cardiology

## 2023-08-29 ENCOUNTER — Other Ambulatory Visit: Payer: Self-pay | Admitting: Cardiology

## 2023-08-29 VITALS — BP 120/76 | HR 58 | Ht 62.6 in

## 2023-08-29 DIAGNOSIS — L7211 Pilar cyst: Secondary | ICD-10-CM | POA: Diagnosis not present

## 2023-08-29 DIAGNOSIS — Z131 Encounter for screening for diabetes mellitus: Secondary | ICD-10-CM | POA: Diagnosis not present

## 2023-08-29 DIAGNOSIS — Z1321 Encounter for screening for nutritional disorder: Secondary | ICD-10-CM

## 2023-08-29 DIAGNOSIS — R002 Palpitations: Secondary | ICD-10-CM

## 2023-08-29 DIAGNOSIS — E782 Mixed hyperlipidemia: Secondary | ICD-10-CM

## 2023-08-29 DIAGNOSIS — I700 Atherosclerosis of aorta: Secondary | ICD-10-CM

## 2023-08-29 DIAGNOSIS — D1721 Benign lipomatous neoplasm of skin and subcutaneous tissue of right arm: Secondary | ICD-10-CM | POA: Diagnosis not present

## 2023-08-29 MED ORDER — ROSUVASTATIN CALCIUM 10 MG PO TABS: 10.0000 mg | ORAL_TABLET | Freq: Every day | ORAL | 3 refills | Status: AC

## 2023-08-29 NOTE — Patient Instructions (Addendum)
Medication Instructions:  Your physician recommends that you continue on your current medications as directed. Please refer to the Current Medication list given to you today.  *If you need a refill on your cardiac medications before your next appointment, please call your pharmacy*   Lab Work: Your physician recommends that you have a CMP, CBC, TSH, A1C, vitamin D and lipids today in the office.  If you have labs (blood work) drawn today and your tests are completely normal, you will receive your results only by: MyChart Message (if you have MyChart) OR A paper copy in the mail If you have any lab test that is abnormal or we need to change your treatment, we will call you to review the results.   Testing/Procedures: None ordered   Follow-Up: At [facility] you and your health needs are our priority.  As part of our continuing mission to provide you with exceptional heart care, we have created designated Provider Care Teams.  These Care Teams include your primary Cardiologist (physician) and Advanced Practice Providers (APPs -  Physician Assistants and Nurse Practitioners) who all work together to provide you with the care you need, when you need it.  We recommend signing up for the patient portal called "MyChart".  Sign up information is provided on this After Visit Summary.  MyChart is used to connect with patients for Virtual Visits (Telemedicine).  Patients are able to view lab/test results, encounter notes, upcoming appointments, etc.  Non-urgent messages can be sent to your provider as well.   To learn more about what you can do with MyChart, go to [email or web address].    Your next appointment:   9 month(s)  The format for your next appointment:   In Person  Provider:   Belva Crome, MD    Other Instructions none  Important Information About Sugar

## 2023-08-29 NOTE — Progress Notes (Signed)
 Cardiology Office Note:    Date:  08/29/2023   ID:  [name], DOB 1960-04-27, MRN [id number]  PCP:  Alvia Bring, DO  Cardiologist:  Jennifer JONELLE Crape, MD   Referring MD: Alvia Bring, DO    ASSESSMENT:    1. Mixed dyslipidemia   2. Aortic atherosclerosis (HCC)   3. Palpitations    PLAN:    In order of problems listed above:  Primary prevention stressed with the patient.  Importance of compliance with diet medication stressed and patient verbalized standing. Mixed dyslipidemia: On lipid-lowering medications.  Lipids are fine but she is due for blood work and will have this done today.  Will also do a hemoglobin A1c and vitamin D  in view of history of deficiency Palpitations: These have completely resolved and she is happy about it. Aortic atherosclerosis: Stable patient aware of this Obesity: Weight reduction stressed diet emphasized.  She was advised to walk at least half an hour a day on a daily basis and she promises to. Patient will be seen in follow-up appointment in 6 months or earlier if the patient has any concerns.    Medication Adjustments/Labs and Tests Ordered: Current medicines are reviewed at length with the patient today.  Concerns regarding medicines are outlined above.  Orders Placed This Encounter  Procedures   EKG 12-Lead   Meds ordered this encounter  Medications   rosuvastatin  (CRESTOR ) 10 MG tablet    Sig: Take 1 tablet (10 mg total) by mouth daily.    Dispense:  90 tablet    Refill:  3     No chief complaint on file.    History of Present Illness:    Tiffany Ross is a 63 y.o. female.  Patient has past medical history of aortic atherosclerosis, mixed dyslipidemia and history of palpitations.  She denies any problems at this time and takes care of activities of daily living.  No chest pain orthopnea or PND.  At the time of my evaluation, the patient is alert awake oriented and in no distress.  She is active but does not exercise on a  regular basis.  Her palpitations have resolved.  At the time of my evaluation, the patient is alert awake oriented and in no distress.  Past Medical History:  Diagnosis Date   Anxiety    Aortic atherosclerosis (HCC) 06/29/2020   Arthralgia of left elbow 05/03/2023   Exertional dyspnea 05/26/2020   Lipoma of left lower extremity 04/14/2016   Mixed dyslipidemia 01/14/2021   Multiple lipomas 05/16/2023   Neck fullness 05/16/2023   Osteoarthritis of right hip 02/07/2017   Palpitations 06/29/2020   Sebaceous cyst 03/27/2016    Past Surgical History:  Procedure Laterality Date   ABDOMINAL HYSTERECTOMY     CESAREAN SECTION     KNEE SURGERY      Current Medications: Current Meds  Medication Sig   cetirizine (ZYRTEC) 10 MG tablet Take 10 mg by mouth as needed for allergies.   estradiol (VIVELLE-DOT) 0.1 MG/24HR patch Place 1 patch onto the skin 2 (two) times a week.   FLUoxetine (PROZAC) 20 MG capsule Take 20 mg by mouth daily.   Omega-3 Fatty Acids (FISH OIL) 1200 MG CAPS Take 1,200 mg by mouth daily.   rosuvastatin  (CRESTOR ) 10 MG tablet Take 1 tablet (10 mg total) by mouth daily.     Allergies:   Sulfur, Codeine, and Sulfa antibiotics   Social History   Socioeconomic History   Marital status: Married  Spouse name: Not on file   Number of children: Not on file   Years of education: Not on file   Highest education level: Not on file  Occupational History   Occupation: Homemaker  Tobacco Use   Smoking status: Never   Smokeless tobacco: Never  Vaping Use   Vaping status: Never Used  Substance and Sexual Activity   Alcohol use: Yes    Alcohol/week: 10.0 standard drinks of alcohol    Types: 10 Glasses of wine per week    Comment: weekly   Drug use: Never   Sexual activity: Yes    Partners: Male    Birth control/protection: Surgical  Other Topics Concern   Not on file  Social History Narrative   Not on file   Social Drivers of Health   Financial Resource  Strain: Low Risk  (05/28/2023)   Received from [facility]   Overall Financial Resource Strain (CARDIA)    Difficulty of Paying Living Expenses: Not hard at all  Food Insecurity: No Food Insecurity (05/28/2023)   Received from [facility]   Hunger Vital Sign    Within the past 12 months, you worried that your food would run out before you got the money to buy more.: Never true    Within the past 12 months, the food you bought just didn't last and you didn't have money to get more.: Never true  Transportation Needs: No Transportation Needs (05/28/2023)   Received from [facility] - Transportation    Lack of Transportation (Medical): No    Lack of Transportation (Non-Medical): No  Physical Activity: Sufficiently Active (05/28/2023)   Received from [facility]   Exercise Vital Sign    On average, how many days per week do you engage in moderate to strenuous exercise (like a brisk walk)?: 4 days    On average, how many minutes do you engage in exercise at this level?: 60 min  Stress: No Stress Concern Present (05/28/2023)   Received from [facility] - Occupational Stress Questionnaire    Feeling of Stress : Not at all  Social Connections: Socially Integrated (05/28/2023)   Received from [facility]   Social Network    How would you rate your social network (family, work, friends)?: Good participation with social networks     Family History: The patient's family history includes Alzheimer's disease in her father; Diabetes in her mother; Hypertension in her brother and mother; Kidney cancer in her mother; Ovarian cancer in her sister. There is no history of Breast cancer.  ROS:   Please see the history of present illness.    All other systems reviewed and are negative.  EKGs/Labs/Other Studies Reviewed:    The following studies were reviewed today: .SABRAEKG Interpretation Date/Time:  Wednesday August 29 2023 14:10:47  EDT Ventricular Rate:  58 PR Interval:  146 QRS Duration:  100 QT Interval:  448 QTC Calculation: 439 R Axis:   53  Text Interpretation: Sinus bradycardia Cannot rule out Anterior infarct , age undetermined No previous ECGs available Confirmed by Edwyna Backers [phone]) on 08/29/2023 2:24:29 PM     Recent Labs: 08/30/2022: ALT 20; Hemoglobin 13.7; Platelets 278 09/07/2022: BUN 13; Creatinine, Ser 0.69; Potassium 4.6; Sodium 140 05/16/2023: TSH 0.999  Recent Lipid Panel    Component Value Date/Time   CHOL 169 08/30/2022 0845   TRIG 85 08/30/2022 0845   HDL 83 08/30/2022 0845   CHOLHDL 2.0 08/30/2022 0845  LDLCALC 70 08/30/2022 0845    Physical Exam:    VS:  BP 120/76   Pulse (!) 58   Ht 5' 2.6 (1.59 m)   SpO2 98%   BMI 28.71 kg/m     Wt Readings from Last 3 Encounters:  05/16/23 160 lb (72.6 kg)  08/30/22 158 lb 0.6 oz (71.7 kg)  11/09/21 156 lb 1.3 oz (70.8 kg)     GEN: Patient is in no acute distress HEENT: Normal NECK: No JVD; No carotid bruits LYMPHATICS: No lymphadenopathy CARDIAC: Hear sounds regular, 2/6 systolic murmur at the apex. RESPIRATORY:  Clear to auscultation without rales, wheezing or rhonchi  ABDOMEN: Soft, non-tender, non-distended MUSCULOSKELETAL:  No edema; No deformity  SKIN: Warm and dry NEUROLOGIC:  Alert and oriented x 3 PSYCHIATRIC:  Normal affect   Signed, Jennifer JONELLE Crape, MD  08/29/2023 2:25 PM    [facility]

## 2023-08-30 ENCOUNTER — Ambulatory Visit: Payer: Self-pay | Admitting: Cardiology

## 2023-08-30 LAB — CBC
Hematocrit: 40 % (ref 34.0–46.6)
Hemoglobin: 13.3 g/dL (ref 11.1–15.9)
MCH: 30.2 pg (ref 26.6–33.0)
MCHC: 33.3 g/dL (ref 31.5–35.7)
MCV: 91 fL (ref 79–97)
Platelets: 276 x10E3/uL (ref 150–450)
RBC: 4.4 x10E6/uL (ref 3.77–5.28)
RDW: 11.9 % (ref 11.7–15.4)
WBC: 5.4 x10E3/uL (ref 3.4–10.8)

## 2023-08-30 LAB — TSH: TSH: 0.892 u[IU]/mL (ref 0.450–4.500)

## 2023-08-30 LAB — LIPID PANEL
Chol/HDL Ratio: 2.1 ratio (ref 0.0–4.4)
Cholesterol, Total: 176 mg/dL (ref 100–199)
HDL: 82 mg/dL (ref >39)
LDL Chol Calc (NIH): 82 mg/dL (ref 0–99)
Triglycerides: 64 mg/dL (ref 0–149)
VLDL Cholesterol Cal: 12 mg/dL (ref 5–40)

## 2023-08-30 LAB — COMPREHENSIVE METABOLIC PANEL WITH GFR
ALT: 15 IU/L (ref 0–32)
AST: 18 IU/L (ref 0–40)
Albumin: 4.4 g/dL (ref 3.9–4.9)
Alkaline Phosphatase: 71 IU/L (ref 44–121)
BUN/Creatinine Ratio: 17 (ref 12–28)
BUN: 14 mg/dL (ref 8–27)
Bilirubin Total: 0.9 mg/dL (ref 0.0–1.2)
CO2: 25 mmol/L (ref 20–29)
Calcium: 9.5 mg/dL (ref 8.7–10.3)
Chloride: 103 mmol/L (ref 96–106)
Creatinine, Ser: 0.82 mg/dL (ref 0.57–1.00)
Globulin, Total: 2.1 g/dL (ref 1.5–4.5)
Glucose: 95 mg/dL (ref 70–99)
Potassium: 4.3 mmol/L (ref 3.5–5.2)
Sodium: 139 mmol/L (ref 134–144)
Total Protein: 6.5 g/dL (ref 6.0–8.5)
eGFR: 80 mL/min/1.73 (ref >59)

## 2023-08-30 LAB — VITAMIN D 25 HYDROXY (VIT D DEFICIENCY, FRACTURES): Vit D, 25-Hydroxy: 36.3 ng/mL (ref 30.0–100.0)

## 2023-08-30 LAB — HEMOGLOBIN A1C
Est. average glucose Bld gHb Est-mCnc: 94 mg/dL
Hgb A1c MFr Bld: 4.9 % (ref 4.8–5.6)

## 2023-09-18 DIAGNOSIS — R208 Other disturbances of skin sensation: Secondary | ICD-10-CM | POA: Diagnosis not present

## 2023-09-18 DIAGNOSIS — L538 Other specified erythematous conditions: Secondary | ICD-10-CM | POA: Diagnosis not present

## 2023-09-18 DIAGNOSIS — L7211 Pilar cyst: Secondary | ICD-10-CM | POA: Diagnosis not present

## 2023-10-21 DIAGNOSIS — T63441A Toxic effect of venom of bees, accidental (unintentional), initial encounter: Secondary | ICD-10-CM | POA: Diagnosis not present

## 2023-10-21 DIAGNOSIS — S60561A Insect bite (nonvenomous) of right hand, initial encounter: Secondary | ICD-10-CM | POA: Diagnosis not present
